# Patient Record
Sex: Male | Born: 1950 | Race: White | Hispanic: No | Marital: Married | Smoking: Never smoker
Health system: Southern US, Community
[De-identification: ages and names within clinical notes are randomized; demographics above are authoritative.]

## PROBLEM LIST (undated history)

## (undated) DIAGNOSIS — C61 Malignant neoplasm of prostate: Secondary | ICD-10-CM

## (undated) DIAGNOSIS — C801 Malignant (primary) neoplasm, unspecified: Secondary | ICD-10-CM

## (undated) DIAGNOSIS — H539 Unspecified visual disturbance: Secondary | ICD-10-CM

## (undated) DIAGNOSIS — R519 Headache, unspecified: Secondary | ICD-10-CM

## (undated) DIAGNOSIS — R51 Headache: Secondary | ICD-10-CM

## (undated) HISTORY — DX: Headache: R51

## (undated) HISTORY — PX: PROSTATECTOMY: SHX69

## (undated) HISTORY — PX: CHOLECYSTECTOMY, LAPAROSCOPIC: SHX56

## (undated) HISTORY — DX: Malignant (primary) neoplasm, unspecified: C80.1

## (undated) HISTORY — DX: Malignant neoplasm of prostate: C61

## (undated) HISTORY — DX: Headache, unspecified: R51.9

## (undated) HISTORY — PX: WISDOM TOOTH EXTRACTION: SHX21

## (undated) HISTORY — DX: Unspecified visual disturbance: H53.9

---

## 1999-06-15 ENCOUNTER — Ambulatory Visit (HOSPITAL_COMMUNITY): Admission: RE | Admit: 1999-06-15 | Discharge: 1999-06-15 | Payer: Self-pay | Admitting: *Deleted

## 2003-03-17 DIAGNOSIS — C61 Malignant neoplasm of prostate: Secondary | ICD-10-CM | POA: Insufficient documentation

## 2003-03-17 DIAGNOSIS — G9332 Myalgic encephalomyelitis/chronic fatigue syndrome: Secondary | ICD-10-CM | POA: Insufficient documentation

## 2013-09-01 DIAGNOSIS — M5430 Sciatica, unspecified side: Secondary | ICD-10-CM | POA: Insufficient documentation

## 2013-09-01 DIAGNOSIS — Q2111 Secundum atrial septal defect: Secondary | ICD-10-CM | POA: Insufficient documentation

## 2013-09-01 DIAGNOSIS — Q210 Ventricular septal defect: Secondary | ICD-10-CM | POA: Insufficient documentation

## 2013-09-01 DIAGNOSIS — C61 Malignant neoplasm of prostate: Secondary | ICD-10-CM | POA: Insufficient documentation

## 2013-09-01 DIAGNOSIS — M5126 Other intervertebral disc displacement, lumbar region: Secondary | ICD-10-CM | POA: Insufficient documentation

## 2013-09-01 DIAGNOSIS — E559 Vitamin D deficiency, unspecified: Secondary | ICD-10-CM | POA: Insufficient documentation

## 2013-09-01 DIAGNOSIS — K436 Other and unspecified ventral hernia with obstruction, without gangrene: Secondary | ICD-10-CM | POA: Insufficient documentation

## 2013-09-01 DIAGNOSIS — F528 Other sexual dysfunction not due to a substance or known physiological condition: Secondary | ICD-10-CM | POA: Insufficient documentation

## 2013-09-01 DIAGNOSIS — R9409 Abnormal results of other function studies of central nervous system: Secondary | ICD-10-CM | POA: Insufficient documentation

## 2013-09-01 DIAGNOSIS — R5382 Chronic fatigue, unspecified: Secondary | ICD-10-CM

## 2013-09-01 DIAGNOSIS — K8020 Calculus of gallbladder without cholecystitis without obstruction: Secondary | ICD-10-CM | POA: Insufficient documentation

## 2013-09-01 DIAGNOSIS — Q211 Atrial septal defect: Secondary | ICD-10-CM | POA: Insufficient documentation

## 2013-09-01 DIAGNOSIS — D1800 Hemangioma unspecified site: Secondary | ICD-10-CM | POA: Insufficient documentation

## 2013-09-01 DIAGNOSIS — H8109 Meniere's disease, unspecified ear: Secondary | ICD-10-CM | POA: Insufficient documentation

## 2013-09-01 DIAGNOSIS — R972 Elevated prostate specific antigen [PSA]: Secondary | ICD-10-CM | POA: Insufficient documentation

## 2013-09-01 DIAGNOSIS — M5431 Sciatica, right side: Secondary | ICD-10-CM | POA: Insufficient documentation

## 2013-09-01 DIAGNOSIS — J3800 Paralysis of vocal cords and larynx, unspecified: Secondary | ICD-10-CM | POA: Insufficient documentation

## 2013-09-01 DIAGNOSIS — K5730 Diverticulosis of large intestine without perforation or abscess without bleeding: Secondary | ICD-10-CM | POA: Insufficient documentation

## 2013-09-01 DIAGNOSIS — R17 Unspecified jaundice: Secondary | ICD-10-CM | POA: Insufficient documentation

## 2013-09-01 DIAGNOSIS — D8989 Other specified disorders involving the immune mechanism, not elsewhere classified: Secondary | ICD-10-CM | POA: Insufficient documentation

## 2013-09-01 DIAGNOSIS — S43429A Sprain of unspecified rotator cuff capsule, initial encounter: Secondary | ICD-10-CM | POA: Insufficient documentation

## 2013-09-01 DIAGNOSIS — J383 Other diseases of vocal cords: Secondary | ICD-10-CM | POA: Insufficient documentation

## 2013-09-01 DIAGNOSIS — K219 Gastro-esophageal reflux disease without esophagitis: Secondary | ICD-10-CM | POA: Insufficient documentation

## 2013-09-01 DIAGNOSIS — G9332 Myalgic encephalomyelitis/chronic fatigue syndrome: Secondary | ICD-10-CM | POA: Insufficient documentation

## 2013-09-01 DIAGNOSIS — R9089 Other abnormal findings on diagnostic imaging of central nervous system: Secondary | ICD-10-CM | POA: Insufficient documentation

## 2013-09-01 DIAGNOSIS — H81319 Aural vertigo, unspecified ear: Secondary | ICD-10-CM | POA: Insufficient documentation

## 2013-09-01 DIAGNOSIS — D485 Neoplasm of uncertain behavior of skin: Secondary | ICD-10-CM | POA: Insufficient documentation

## 2013-09-01 DIAGNOSIS — B0089 Other herpesviral infection: Secondary | ICD-10-CM | POA: Insufficient documentation

## 2013-12-04 DIAGNOSIS — G43019 Migraine without aura, intractable, without status migrainosus: Secondary | ICD-10-CM | POA: Insufficient documentation

## 2013-12-28 DIAGNOSIS — K644 Residual hemorrhoidal skin tags: Secondary | ICD-10-CM | POA: Insufficient documentation

## 2014-06-27 DIAGNOSIS — G43009 Migraine without aura, not intractable, without status migrainosus: Secondary | ICD-10-CM | POA: Insufficient documentation

## 2014-08-28 ENCOUNTER — Telehealth: Payer: Self-pay | Admitting: Neurology

## 2014-08-28 NOTE — Telephone Encounter (Signed)
I have spoken with [facility] at [facility], and advised that although Mr. Ohlin was a former pt. of Dr. Garth Bigness at [facility],  he has not so far been seen at [facility]--we are not able to refill medications for him until he has been seen in this office.  Myra verbalized understanding of same/fim

## 2014-08-28 NOTE — Telephone Encounter (Signed)
I have spoken with Eduardo Lin this afternoon and advised that unfortunately, [facility] is not able to r/f meds until he is seen in this office.  He verbalized understanding of same/fim

## 2014-08-28 NOTE — Telephone Encounter (Signed)
Myra with [facility] would like a call back regarding the 90-day refill for patient. Please call Myra at [phone]

## 2014-08-28 NOTE — Telephone Encounter (Signed)
Patient is calling requesting a medication refill. Please call patient back and advise.

## 2014-09-04 ENCOUNTER — Encounter: Payer: Self-pay | Admitting: Neurology

## 2014-09-04 ENCOUNTER — Ambulatory Visit (INDEPENDENT_AMBULATORY_CARE_PROVIDER_SITE_OTHER): Payer: BLUE CROSS/BLUE SHIELD | Admitting: Neurology

## 2014-09-04 ENCOUNTER — Ambulatory Visit: Payer: Self-pay | Admitting: Neurology

## 2014-09-04 VITALS — BP 114/68 | HR 72 | Resp 14 | Ht 74.0 in | Wt 180.2 lb

## 2014-09-04 DIAGNOSIS — R5382 Chronic fatigue, unspecified: Secondary | ICD-10-CM | POA: Diagnosis not present

## 2014-09-04 DIAGNOSIS — G43019 Migraine without aura, intractable, without status migrainosus: Secondary | ICD-10-CM | POA: Diagnosis not present

## 2014-09-04 DIAGNOSIS — D8989 Other specified disorders involving the immune mechanism, not elsewhere classified: Secondary | ICD-10-CM

## 2014-09-04 MED ORDER — SUMATRIPTAN SUCCINATE 100 MG PO TABS: ORAL_TABLET | ORAL | Status: DC

## 2014-09-04 NOTE — Progress Notes (Signed)
[facility]  PATIENT: Eduardo Lin DOB: 04/24/1950  REFERRING DOCTOR OR PCP:  Jesse Fall  SOURCE: patient and records form [facility]  _________________________________   HISTORICAL  CHIEF COMPLAINT:  Chief Complaint  Patient presents with  . Headaches    Former pt. of Dr. Garth Bigness from [facility], sts. he has had h/a's for yrs.--sts. he is currently taking Zonisamide 200mg  daily, and uses Imitrex prn.  Sts. it is difficult to tell if Zonisamide helps--sts. he goes thru spells where h/a's are daily--but sts. if he does get a h/a, Imitrex does help.  Sts. h/a's are disabling and prevent him from working/fim  . Fatigue    Sts. Dr. Lenna Gilford treats him for chronic fatigue syndrome--he is not currently taking any meds for fatigue--sts. nothing has ever helped/fim    HISTORY OF PRESENT ILLNESS:  [name] is a 64 yo man who I have followed in the past for Migraine Headaches and Chronic Fatigue Syndrome.     Chronic Headache:   He continues to have frequent migraine headaches.     His migraines are chronic and he is having 3 or 4 every week for the majority of the day when they occur.  He gets a throbbing ice pick sensation in the top of the head. Sometimes, moving will increase the pain. He sometimes will have photophobia but does not have phonophobia.. He does not get nausea or vomiting, in general. Sometimes, he will have pain in the back of the neck, as well.   He received some benefit with his zonisamide at  200 mg nightly. However, the headaches are still occurring about 15 times a month. Most of the time, he will not treat the headaches but will use Imitrex when the headaches are more severe.   He was recently prescribed Imitrex shots.   Due to his fatigue, I have not had him try a tricyclic or topiramate as they might worsen his symptoms  Chronic Fatigue:    He has had chronic fatigue for several years that worsened in 2014. The fatigue is daily. He  notes that he wants to do certain tasks but just feels his muscles become  too tired to let him do various things.   We have tried several different medications to try to help including amantadine, Adderall, and Provigil. He sleeps fairly well at night and will usually wake up just wants for nocturia. He had a sleep study done in 2008 and had no OSA. He is within does not snore. In the past, he has seen an infectious disease at [facility] and [place] for the chronic fatigue but no definitive cause of the CFS was ever identified.  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 However, he has been unable to do these higher-level jobs for the past few years due to his chronic fatigue. He even tried to do a more sedentary repetitive job at the [employer] but was unable to do his duties and left after 2 weeks.   He has difficulty completing more physical chores around the home.   REVIEW OF SYSTEMS: Constitutional: No fevers, chills, sweats, or change in appetite.   He has fatigue Eyes: No visual changes, double vision, eye pain Ear, nose and throat: No hearing loss, ear pain, nasal congestion, sore throat Cardiovascular: No chest pain, palpitations Respiratory: No shortness of breath at rest or with exertion.  No wheezes. No snoring. GastrointestinaI: No nausea, vomiting, diarrhea, abdominal pain, fecal incontinence Genitourinary: No dysuria, urinary retention or frequency.  Once nightly nocturia. Musculoskeletal: No neck pain, back pain Integumentary: No rash, pruritus, skin lesions Neurological: as above Psychiatric: No depression at this time.  No anxiety Endocrine: No palpitations, diaphoresis, change in appetite, change in weigh or increased thirst Hematologic/Lymphatic: No anemia, purpura, petechiae. Allergic/Immunologic: No itchy/runny eyes, nasal congestion, recent allergic reactions,  rashes  ALLERGIES: Allergies  Allergen Reactions  . Hydrocodone-Acetaminophen     Other reaction(s): Other (See Comments) Other Reaction: hallucinate    HOME MEDICATIONS:  Current outpatient prescriptions:  .  Cholecalciferol (VITAMIN D3) 1000 UNITS CAPS, Take 3 caps daily, Disp: , Rfl:  .  Magnesium 250 MG TABS, Take 2 tabs daily, Disp: , Rfl:  .  Probiotic Product (PROBIOTIC & ACIDOPHILUS EX ST PO), , Disp: , Rfl:  .  SUMAtriptan (IMITREX) 100 MG tablet, 100 mg., Disp: , Rfl:  .  zonisamide (ZONEGRAN) 100 MG capsule, TAKE 2 CAPSULE BEDTIME, Disp: , Rfl: 3  PAST MEDICAL HISTORY: Past Medical History  Diagnosis Date  . Cancer   . Prostate cancer   . Headache   . Vision abnormalities     PAST SURGICAL HISTORY: Past Surgical History  Procedure Laterality Date  . Prostatectomy    . Cholecystectomy, laparoscopic    . Wisdom tooth extraction      FAMILY HISTORY: History reviewed. No pertinent family history.  SOCIAL HISTORY:  History   Social History  . Marital Status: Married    Spouse Name: N/A  . Number of Children: N/A  . Years of Education: N/A   Occupational History  . Not on file.   Social History Main Topics  . Smoking status: Never Smoker   . Smokeless tobacco: Not on file  . Alcohol Use: No  . Drug Use: No  . Sexual Activity: Not on file   Other Topics Concern  . Not on file   Social History Narrative  . No narrative on file     PHYSICAL EXAM  Filed Vitals:   09/04/14 1353  BP: 114/68  Pulse: 72  Resp: 14  Height: 6\' 2"  (1.88 m)  Weight: 180 lb 3.2 oz (81.738 kg)    Body mass index is 23.13 kg/(m^2).   General: The patient is well-developed and well-nourished and in no acute distress  Eyes:  Funduscopic exam shows normal optic discs and retinal vessels.  Neck: The neck is supple, no carotid bruits are noted.  The neck is nontender.  Cardiovascular: The heart has a regular rate and rhythm with a normal S1 and S2. There were no  murmurs, gallops or rubs. Lungs are clear to auscultation.  Skin: Extremities are without significant edema.  Musculoskeletal:  Back is nontender  Neurologic Exam  Mental status: The patient is alert and oriented x 3 at the time of the examination. The patient has apparent normal recent and remote memory, with an apparently normal attention span and concentration ability.   Speech is normal.  Cranial nerves: Extraocular movements are full. Pupils are equal, round, and reactive to light and accomodation.  Visual fields are full.  Facial symmetry is present. There is good facial sensation to soft touch bilaterally.Facial strength is normal.  Trapezius and sternocleidomastoid strength is normal. No dysarthria is noted.  The tongue is midline, and the patient has symmetric elevation of the soft palate. No obvious hearing deficits are noted.  Motor:  Muscle bulk is normal.  Tone is normal. Strength is  5 / 5 in all 4 extremities.   Sensory: Sensory testing is intact to pinprick, soft touch and vibration sensation in all 4 extremities.  Coordination: Cerebellar testing reveals good finger-nose-finger and heel-to-shin bilaterally.  Gait and station: Station is normal.   Gait is normal. Tandem gait is normal. Romberg is negative.   Reflexes: Deep tendon reflexes are symmetric and normal bilaterally.   Plantar responses are flexor.    DIAGNOSTIC DATA (LABS, IMAGING, TESTING) - I reviewed patient records, labs, notes, testing and imaging myself where available.     ASSESSMENT AND PLAN  Common migraine with intractable migraine  CFIDS (chronic fatigue and immune dysfunction syndrome)   1.   Continues zonisamide with prn Imitrex for the migraine headaches. 2.   His chronic fatigue has not responded to many different medications. He is having difficulty functioning with day-to-day chores and was unable to do sedentary job. Due to his chronic fatigue, I believe that he is disabled and he  will not be able to find employment. 3.  Return in 6 months, sooner if new or worsening neurologic symptoms.     Richard A. Felecia Shelling, MD, PhD 8/88/2800, 3:49 PM Certified in Neurology, Clinical Neurophysiology, Sleep Medicine, Pain Medicine and Neuroimaging  [facility] [address], [place] [employer] [address] [phone]

## 2015-03-13 ENCOUNTER — Ambulatory Visit: Payer: BLUE CROSS/BLUE SHIELD | Admitting: Neurology

## 2015-03-14 ENCOUNTER — Ambulatory Visit (INDEPENDENT_AMBULATORY_CARE_PROVIDER_SITE_OTHER): Payer: BLUE CROSS/BLUE SHIELD | Admitting: Neurology

## 2015-03-14 ENCOUNTER — Encounter: Payer: Self-pay | Admitting: Neurology

## 2015-03-14 VITALS — BP 106/70 | HR 63 | Ht 74.0 in | Wt 177.6 lb

## 2015-03-14 DIAGNOSIS — H81313 Aural vertigo, bilateral: Secondary | ICD-10-CM

## 2015-03-14 DIAGNOSIS — R928 Other abnormal and inconclusive findings on diagnostic imaging of breast: Secondary | ICD-10-CM

## 2015-03-14 DIAGNOSIS — R5382 Chronic fatigue, unspecified: Secondary | ICD-10-CM

## 2015-03-14 DIAGNOSIS — D8989 Other specified disorders involving the immune mechanism, not elsewhere classified: Secondary | ICD-10-CM

## 2015-03-14 DIAGNOSIS — R51 Headache: Secondary | ICD-10-CM | POA: Diagnosis not present

## 2015-03-14 DIAGNOSIS — G8929 Other chronic pain: Secondary | ICD-10-CM | POA: Insufficient documentation

## 2015-03-14 DIAGNOSIS — R9409 Abnormal results of other function studies of central nervous system: Secondary | ICD-10-CM

## 2015-03-14 NOTE — Progress Notes (Addendum)
[facility]  PATIENT: Eduardo Lin DOB: 1950-11-09  REFERRING DOCTOR OR PCP:  Jesse Fall  SOURCE: patient and records form [facility]  _________________________________   HISTORICAL  CHIEF COMPLAINT:  Chief Complaint  Patient presents with  . Follow-up    migraine and chronic faitgue syndrome    HISTORY OF PRESENT ILLNESS:  Eduardo Lin is a 64 yo man who I have followed in the past for Migraine Headaches and Chronic Fatigue Syndrome.     He feels both issues are no better.      Chronic Headache:   He reports frequent migraine headaches that are worsening.   His migraines are chronic and he is having 3 or 4 every week for the majority of the day when they occur.  He has them 15 days / week.  Marland Kitchen  He reports a throbbing ice pick sensation in the top of the head at times about 1/2 the week. Sometimes, moving will increase the pain. He sometimes will have photophobia but does not have phonophobia.. He does not get nausea or vomiting, in general. Sometimes, he will have pain in the back of the neck, as well.   This pain can occur with the other headache or separatey.   He received some benefit with his zonisamide at  200 mg nightly.  Most of the time, he will not treat the headaches but will use Imitrex when the headaches are more severe with benefit.   He was recently prescribed Imitrex shots.   Due to his fatigue, I have not had him try a tricyclic or topiramate as they might worsen his symptoms.  Chronic Fatigue:    He reports chronic fatigue since prostate surgery in 2005 that worsened in 2014. The fatigue is daily. He notes that he wants to do certain tasks but just feels his muscles become  too tired to let him do various things.   We have tried several different medications to try to help including amantadine, Adderall, and Provigil.None of the stimulants helped so they were stopped.    He sleeps fairly well at night and will usually wake up just for  nocturia x 1. He had a sleep study done in 2008 and had no OSA. He is within does not snore. Besides [place], In the past, he has also seen an infectious disease at [facility] and [place] for the chronic fatigue but no definitive cause of the CFS was ever identified.  Vertigo:   He has seen Dr. May, ENT at [facility].   He notes that MRI at [facility] in 01/25/2006 showed a lot of white matter changes "white matter disease with leukoaraiosis".   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 However, he has been unable to do these higher-level jobs for the past few years due to his chronic fatigue. He even tried to do a more sedentary repetitive job at the [employer] but was unable to do his duties and left after 2 weeks.   He has difficulty completing more physical chores around the home.   REVIEW OF SYSTEMS: Constitutional: No fevers, chills, sweats, or change in appetite.   He has fatigue Eyes: No visual changes, double vision, eye pain Ear, nose and throat: No hearing loss, ear pain, nasal congestion, sore throat Cardiovascular:  No chest pain, palpitations Respiratory: No shortness of breath at rest or with exertion.   No wheezes. No snoring. GastrointestinaI: No nausea, vomiting, diarrhea, abdominal pain, fecal incontinence Genitourinary: No dysuria, urinary retention or frequency.  Once nightly nocturia. Musculoskeletal: No neck pain, back pain Integumentary: No rash, pruritus, skin lesions Neurological: as above Psychiatric: No depression at this time.  No anxiety Endocrine: No palpitations, diaphoresis, change in appetite, change in weigh or increased thirst Hematologic/Lymphatic: No anemia, purpura, petechiae. Allergic/Immunologic: No itchy/runny eyes, nasal congestion, recent allergic reactions,  rashes  ALLERGIES: Allergies  Allergen Reactions  . Hydrocodone-Acetaminophen     Other reaction(s): Other (See Comments) Other Reaction: hallucinate    HOME MEDICATIONS:  Current outpatient prescriptions:  .  Cholecalciferol (VITAMIN D3) 1000 UNITS CAPS, take 2 daily, Disp: , Rfl:  .  fluticasone (FLONASE) 50 MCG/ACT nasal spray, , Disp: , Rfl:  .  Magnesium 250 MG TABS, Take 1 daily, Disp: , Rfl:  .  Probiotic Product (PROBIOTIC & ACIDOPHILUS EX ST PO), , Disp: , Rfl:  .  SUMAtriptan (IMITREX) 100 MG tablet, 1 pill as needed when necessary headache. May repeat in 2 hours if necessary., Disp: 30 tablet, Rfl: 3 .  zonisamide (ZONEGRAN) 100 MG capsule, TAKE 2 CAPSULE BEDTIME, Disp: , Rfl: 3  PAST MEDICAL HISTORY: Past Medical History  Diagnosis Date  . Cancer ([place])   . Prostate cancer ([place])   . Headache   . Vision abnormalities     PAST SURGICAL HISTORY: Past Surgical History  Procedure Laterality Date  . Prostatectomy    . Cholecystectomy, laparoscopic    . Wisdom tooth extraction      FAMILY HISTORY: History reviewed. No pertinent family history.  SOCIAL HISTORY:  Social History   Social History  . Marital Status: Married    Spouse Name: N/A  . Number of Children: N/A  . Years of Education: N/A   Occupational History  . Not on file.   Social History Main Topics  . Smoking status: Never Smoker   . Smokeless tobacco: Not on file  . Alcohol Use: No  . Drug Use: No  . Sexual Activity: Not on file   Other Topics Concern  . Not on file   Social History Narrative     PHYSICAL EXAM  Filed Vitals:   03/14/15 1132  BP: 106/70  Pulse: 63  Height: 6\' 2"  (1.88 m)  Weight: 177 lb 9.6 oz (80.559 kg)    Body mass index is 22.79 kg/(m^2).   General: The patient is well-developed and well-nourished and in no acute distress  Eyes:  Funduscopic exam shows normal optic discs and retinal vessels.  Neck: The neck is supple, no carotid bruits are noted.   The neck is non-tender today  Cardiovascular: The heart has a regular rate and rhythm with a normal S1 and S2. There were no murmurs, gallops or rubs. Lungs are clear to auscultation.  Skin: Extremities are without significant edema.  Musculoskeletal:  Back is nontender  Neurologic Exam  Mental status: The patient is alert and oriented x 3 at the time of the examination. The patient has apparent normal recent and remote memory, with an apparently normal attention span and concentration ability.   Speech is normal.  Cranial nerves: Extraocular movements are full. Pupils are equal, round, and reactive to light and accomodation.  Visual fields are full.  Facial symmetry is present. There is good facial sensation to soft touch bilaterally.Facial strength is normal.  Trapezius and sternocleidomastoid  strength is normal. No dysarthria is noted.  The tongue is midline, and the patient has symmetric elevation of the soft palate. No obvious hearing deficits are noted.  Motor:  Muscle bulk is normal.   Tone is normal. Strength is  5 / 5 in all 4 extremities.   Sensory: Sensory testing is intact to touch and vibration sensation in all 4 extremities.  Coordination: Cerebellar testing reveals good finger-nose-finger bilaterally.  Gait and station: Station is normal.   Gait is mildly wide. Tandem gait is wide. Romberg is negative.   Reflexes: Deep tendon reflexes are symmetric and normal bilaterally.   Plantar responses are flexor.    DIAGNOSTIC DATA (LABS, IMAGING, TESTING) - I reviewed patient records, labs, notes, testing and imaging myself where available.     ASSESSMENT AND PLAN  CFIDS (chronic fatigue and immune dysfunction syndrome)  Abnormal CNS function study  Auditory vertigo, bilateral - Plan: MR Brain W Wo Contrast  Chronic nonintractable headache, unspecified headache type - Plan: MR Brain W Wo Contrast   1.   Continues zonisamide with prn Imitrex for the migraine  headaches. 2.   We will check an MRI of the brain due to his worsening headaches and vertigo. I will try to get his 2007 MRI that was abnormal to compare if it is available.   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   if pattern is consistent with CADASIL, consider checking for notch 3 mutation. 3.   His chronic fatigue has not responded to many different medications. He is having difficulty functioning with day-to-day chores and unable to do sedentary job. Due to his chronic fatigue, I believe that he is disabled and he will not be able to find employment. 3.  Return in 6 months, sooner if new or worsening neurologic symptoms.  45 minutes face to face interaction with > 1/2 of the time counseling and coordinating care about his symptoms   Richard A. Felecia Shelling, MD, PhD [id number], [id number] AM Certified in Neurology, Clinical Neurophysiology, Sleep Medicine, Pain Medicine and Neuroimaging  [facility] [address], [place], [place] [address] [phone] tt

## 2015-03-20 ENCOUNTER — Telehealth: Payer: Self-pay | Admitting: *Deleted

## 2015-03-20 NOTE — Telephone Encounter (Signed)
Release fax to [facility] imaging requesting MRI CD.

## 2015-03-22 ENCOUNTER — Telehealth: Payer: Self-pay | Admitting: Neurology

## 2015-03-22 NOTE — Telephone Encounter (Signed)
Patient returned Faith's call °

## 2015-03-22 NOTE — Telephone Encounter (Signed)
[place].  I have requested 2013 mri brain from [facility] and 03-15-15 mri brain from [facility]/fim

## 2015-03-22 NOTE — Telephone Encounter (Signed)
Faith, please let him know that I got the MRI report from [place] and shows some nonspecific white matter spots. Usually when there adjusts some scattered spots like this, it is related to aging and is not meaningful.  I will try to get the actual MRI images from [facility] and compare them with the MRI from [facility] that were done in the past. Unfortunate, I do not have either of the images now so we'll need to get them both mailed to me.  Please also see if we can get the images from [facility] (03/15/15 MRI     [id number]) and [facility] (2013 MRI)

## 2015-03-22 NOTE — Telephone Encounter (Signed)
I have spoken with Eduardo Lin this afternoon and per RAS, advised that he has received the mri report from [facility] but has not actually seen the mri images himself.  I advised that per RAS, report notes nonspecific white matter spots, and that these spots are usually related to aging and are not meaningful.  Eduardo Lin verbalized understanding of same.  Eduardo Lin stated he felt RAS was going to compare mri to a 2007 mri.  I have already requested 2013 mri for comparison--I did call [facility] again and request all mri brains that they have for pt. on cd and mailed to RAS/fim

## 2015-03-22 NOTE — Telephone Encounter (Signed)
Margarete at [facility] called and says the imaging cd will either be mailed out Monday or Tuesday.

## 2015-04-24 ENCOUNTER — Telehealth: Payer: Self-pay | Admitting: Neurology

## 2015-04-24 NOTE — Telephone Encounter (Signed)
I have compared the 03/12/2009, 04/12/2011 and 03/05/2015 MRI (the 2007 MRI will not load on 2 different computers)  Over the MRI show T2/FLAIR hyperintense foci, predominantly in the subcortical white matter consistent with chronic microvascular ischemic change. Over the 3 MRIs, there has been only minimal progression.   The 2016 MRI was on a 3T magnet

## 2015-05-22 ENCOUNTER — Encounter: Payer: Self-pay | Admitting: Neurology

## 2015-07-04 ENCOUNTER — Ambulatory Visit: Payer: BLUE CROSS/BLUE SHIELD | Admitting: Neurology

## 2015-08-06 DIAGNOSIS — N3281 Overactive bladder: Secondary | ICD-10-CM | POA: Insufficient documentation

## 2015-08-06 DIAGNOSIS — N5231 Erectile dysfunction following radical prostatectomy: Secondary | ICD-10-CM | POA: Insufficient documentation

## 2015-08-06 DIAGNOSIS — N529 Male erectile dysfunction, unspecified: Secondary | ICD-10-CM | POA: Insufficient documentation

## 2015-09-09 ENCOUNTER — Encounter: Payer: Self-pay | Admitting: Neurology

## 2015-09-09 ENCOUNTER — Ambulatory Visit (INDEPENDENT_AMBULATORY_CARE_PROVIDER_SITE_OTHER): Payer: BLUE CROSS/BLUE SHIELD | Admitting: Neurology

## 2015-09-09 VITALS — BP 116/72 | HR 68 | Resp 12 | Ht 74.0 in | Wt 175.0 lb

## 2015-09-09 DIAGNOSIS — D8989 Other specified disorders involving the immune mechanism, not elsewhere classified: Secondary | ICD-10-CM

## 2015-09-09 DIAGNOSIS — G43019 Migraine without aura, intractable, without status migrainosus: Secondary | ICD-10-CM

## 2015-09-09 DIAGNOSIS — R5382 Chronic fatigue, unspecified: Secondary | ICD-10-CM | POA: Diagnosis not present

## 2015-09-09 DIAGNOSIS — G9332 Myalgic encephalomyelitis/chronic fatigue syndrome: Secondary | ICD-10-CM

## 2015-09-09 DIAGNOSIS — J309 Allergic rhinitis, unspecified: Secondary | ICD-10-CM | POA: Insufficient documentation

## 2015-09-09 DIAGNOSIS — G43709 Chronic migraine without aura, not intractable, without status migrainosus: Secondary | ICD-10-CM | POA: Insufficient documentation

## 2015-09-09 DIAGNOSIS — G43909 Migraine, unspecified, not intractable, without status migrainosus: Secondary | ICD-10-CM | POA: Insufficient documentation

## 2015-09-09 DIAGNOSIS — IMO0002 Reserved for concepts with insufficient information to code with codable children: Secondary | ICD-10-CM | POA: Insufficient documentation

## 2015-09-09 MED ORDER — SUMATRIPTAN SUCCINATE 100 MG PO TABS: ORAL_TABLET | ORAL | Status: DC

## 2015-09-09 MED ORDER — NORTRIPTYLINE HCL 25 MG PO CAPS: 25.0000 mg | ORAL_CAPSULE | Freq: Every day | ORAL | Status: DC

## 2015-09-09 MED ORDER — ZONISAMIDE 100 MG PO CAPS
200.0000 mg | ORAL_CAPSULE | Freq: Every day | ORAL | Status: DC
Start: 2015-09-09 — End: 2017-09-27

## 2015-09-09 NOTE — Progress Notes (Signed)
[facility]  PATIENT: Eduardo Lin DOB: 02-22-1951  REFERRING DOCTOR OR PCP:  Jesse Fall  SOURCE: patient and records form [facility]  _________________________________   HISTORICAL  CHIEF COMPLAINT:  Chief Complaint  Patient presents with  . Chronic Fatigue    Sts. fatigue is worse/fim  . Migraines    HISTORY OF PRESENT ILLNESS:  Eduardo Lin is a 65 y.o.man who I have followed in the past for Migraine Headaches and Chronic Fatigue Syndrome.     He feels both issues are worse.    Fatigue is constant now.      Migraines are occuring more frequently.   He feels mentally foggy.   .      Chronic Fatigue:    He reports chronic fatigue x many years that is both physical and cognitive.    He began to experience it in 2005 after surgery and fatigue worsened in 2014. The fatigue is daily and present upon awakening but usually worsens as the day goes on.      We have tried several different medications to try to help including amantadine, Adderall, and Provigil.    None of the stimulants helped so they were stopped.   Many years ago, he saw an infectious disease at [facility] and [place] for the chronic fatigue but no definitive cause of the CFS was ever identified.     CFS may be related to the white matter changes seen on MRI.  TSH, CBC, Chemistries and Vit D have been.       He sleeps fairly well at night and will usually wake up just for nocturia x 1 (was worse before Vesicare). He had a sleep study done in 2008 and had no OSA. He  does not snore.       Chronic Headache:   He reports frequent migraine headaches that are worsening.   His migraines are chronic and he is having 3 or 4 every week for the majority of the day when they occur.  He has them 18 days / month.  .  He reports a throbbing ice pick sensation in the top of the head at times about 1/2 the week. He gets neck pain also.  Sometimes, moving will increase the pain.    He sometimes will have  photophobia but does not have phonophobia.. He denies nausea or vomiting    He received some benefit with his zonisamide at  200 mg nightly at first but now headache frequency is high again.  He will use Imitrex when the headaches are more severe with benefit.   He was recently prescribed Imitrex shots.   Nortriptyline has also been tried.    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 However, he has been unable to do these higher-level jobs for the past few years due to his chronic fatigue. He even tried to do a more sedentary repetitive job at the [employer] but was unable to do his duties and left after 2 weeks.   He has difficulty completing more physical chores around the home.   REVIEW OF SYSTEMS: Constitutional: No fevers, chills, sweats, or change in appetite.   He has fatigue Eyes: No visual changes, double vision, eye pain Ear, nose and throat: No hearing loss, ear pain, nasal congestion, sore throat Cardiovascular: No chest pain, palpitations Respiratory: No shortness of breath at rest  or with exertion.   No wheezes. No snoring. GastrointestinaI: No nausea, vomiting, diarrhea, abdominal pain, fecal incontinence Genitourinary: No dysuria, urinary retention or frequency.  Once nightly nocturia. Musculoskeletal: No neck pain, back pain Integumentary: No rash, pruritus, skin lesions Neurological: as above Psychiatric: No depression at this time.  No anxiety Endocrine: No palpitations, diaphoresis, change in appetite, change in weigh or increased thirst Hematologic/Lymphatic: No anemia, purpura, petechiae. Allergic/Immunologic: No itchy/runny eyes, nasal congestion, recent allergic reactions, rashes  ALLERGIES: Allergies  Allergen Reactions  . Hydrocodone-Acetaminophen     Other reaction(s): Other (See Comments) Other Reaction: hallucinate    HOME MEDICATIONS:  Current  outpatient prescriptions:  .  Cholecalciferol (VITAMIN D3) 1000 UNITS CAPS, take 2 daily, Disp: , Rfl:  .  fluticasone (FLONASE) 50 MCG/ACT nasal spray, , Disp: , Rfl:  .  Magnesium 250 MG TABS, Take 1 daily, Disp: , Rfl:  .  Probiotic Product (PROBIOTIC & ACIDOPHILUS EX ST PO), , Disp: , Rfl:  .  SUMAtriptan (IMITREX) 100 MG tablet, 1 pill as needed when necessary headache. May repeat in 2 hours if necessary., Disp: 30 tablet, Rfl: 3 .  zonisamide (ZONEGRAN) 100 MG capsule, Take 2 capsules (200 mg total) by mouth daily., Disp: 180 capsule, Rfl: 3 .  nortriptyline (PAMELOR) 25 MG capsule, Take 1 capsule (25 mg total) by mouth at bedtime., Disp: 30 capsule, Rfl: 5 .  VESICARE 10 MG tablet, TAKE 1 TABLET (10 MG TOTAL) BY MOUTH ONCE DAILY., Disp: , Rfl: 11  PAST MEDICAL HISTORY: Past Medical History  Diagnosis Date  . Cancer ([place])   . Prostate cancer ([place])   . Headache   . Vision abnormalities     PAST SURGICAL HISTORY: Past Surgical History  Procedure Laterality Date  . Prostatectomy    . Cholecystectomy, laparoscopic    . Wisdom tooth extraction      FAMILY HISTORY: History reviewed. No pertinent family history.  SOCIAL HISTORY:  Social History   Social History  . Marital Status: Married    Spouse Name: N/A  . Number of Children: N/A  . Years of Education: N/A   Occupational History  . Not on file.   Social History Main Topics  . Smoking status: Never Smoker   . Smokeless tobacco: Not on file  . Alcohol Use: No  . Drug Use: No  . Sexual Activity: Not on file   Other Topics Concern  . Not on file   Social History Narrative     PHYSICAL EXAM  Filed Vitals:   09/09/15 1106  BP: 116/72  Pulse: 68  Resp: 12  Height: 6\' 2"  (1.88 m)  Weight: 175 lb (79.379 kg)    Body mass index is 22.46 kg/(m^2).   General: The patient is well-developed and well-nourished and in no acute distress  Skin: Extremities are without rash or edema.  Musculoskeletal:  Back  is nontender  Neurologic Exam  Mental status: The patient is alert and oriented x 3 at the time of the examination. The patient has apparent normal recent and remote memory, with an apparently normal attention span and concentration ability.   Speech is normal.  Cranial nerves: Extraocular movements are full.  Facial symmetry is present. There is good facial sensation to soft touch bilaterally.Facial strength is normal.  Trapezius and sternocleidomastoid strength is normal. No dysarthria is noted.  The tongue is midline, and the patient has symmetric elevation of the soft palate. No obvious hearing deficits are noted.  Motor:  Muscle bulk  is normal.   Tone is normal. Strength is  5 / 5 in all 4 extremities.   Sensory: Sensory testing is intact to touch and vibration sensation in all 4 extremities.  Coordination: Cerebellar testing reveals good finger-nose-finger bilaterally.  Gait and station: Station is normal.   Gait is mildly wide. Tandem gait is wide. Romberg is negative.   Reflexes: Deep tendon reflexes are symmetric and normal bilaterally.  Marland Kitchen    DIAGNOSTIC DATA (LABS, IMAGING, TESTING) - I reviewed patient records, labs, notes, testing and imaging myself where available.     ASSESSMENT AND PLAN  Common migraine with intractable migraine  CFIDS (chronic fatigue and immune dysfunction syndrome) - Plan: B. burgdorfi antibodies, TSH  Chronic migraine    1.    His chronic fatigue has not responded to different medications including stimulants.    He is having difficulty functioning with day-to-day chores and unable to do sedentary job. Due to his chronic fatigue, I believe that he is disabled and he will not be able to find employment. 2.    Check Lyme/Western and TSH 3.    He has failed multiple different med's for chronic migraine.   Continue zonisamide.  Add  Nortriptyline 25 mg at bedtime.     If no better in 6 weeks, he will call back and we will consider Botox.    4..    Return in 6 months, sooner if new or worsening neurologic symptoms.  45 minutes face to face interaction with > 1/2 of the time counseling and coordinating care about his symptoms and work/disabiliy issues.   Janal Haak A. Felecia Shelling, MD, PhD [id number], [id number] AM Certified in Neurology, Clinical Neurophysiology, Sleep Medicine, Pain Medicine and Neuroimaging  [facility] [address], [place], [place] [address] [phone] [;

## 2015-09-10 LAB — B. BURGDORFI ANTIBODIES

## 2015-09-10 LAB — TSH: TSH: 2.12 u[IU]/mL (ref 0.450–4.500)

## 2015-09-11 ENCOUNTER — Telehealth: Payer: Self-pay

## 2015-09-11 NOTE — Telephone Encounter (Signed)
-----   Message from Britt Bottom, MD sent at 09/10/2015  6:02 PM EDT ----- Please let him know that the Lyme antibodies and the thyroid tests were both negative.

## 2015-09-11 NOTE — Telephone Encounter (Signed)
I spoke to pt and advised him that per Dr. Felecia Shelling, his lyme antibodies and thyroid tests were both negative. Pt verbalized understanding of results. Pt had no questions at this time but was encouraged to call back if questions arise.

## 2016-03-25 ENCOUNTER — Ambulatory Visit (INDEPENDENT_AMBULATORY_CARE_PROVIDER_SITE_OTHER): Payer: BLUE CROSS/BLUE SHIELD | Admitting: Neurology

## 2016-03-25 ENCOUNTER — Encounter: Payer: Self-pay | Admitting: Neurology

## 2016-03-25 VITALS — BP 128/78 | HR 72 | Resp 18 | Ht 74.0 in | Wt 183.0 lb

## 2016-03-25 DIAGNOSIS — G43709 Chronic migraine without aura, not intractable, without status migrainosus: Secondary | ICD-10-CM | POA: Diagnosis not present

## 2016-03-25 DIAGNOSIS — M791 Myalgia, unspecified site: Secondary | ICD-10-CM | POA: Insufficient documentation

## 2016-03-25 DIAGNOSIS — M542 Cervicalgia: Secondary | ICD-10-CM | POA: Diagnosis not present

## 2016-03-25 DIAGNOSIS — IMO0002 Reserved for concepts with insufficient information to code with codable children: Secondary | ICD-10-CM

## 2016-03-25 DIAGNOSIS — R5382 Chronic fatigue, unspecified: Secondary | ICD-10-CM | POA: Diagnosis not present

## 2016-03-25 DIAGNOSIS — D8989 Other specified disorders involving the immune mechanism, not elsewhere classified: Secondary | ICD-10-CM

## 2016-03-25 MED ORDER — CYCLOBENZAPRINE HCL 5 MG PO TABS: 5.0000 mg | ORAL_TABLET | Freq: Every day | ORAL | 5 refills | Status: DC

## 2016-03-25 NOTE — Progress Notes (Signed)
[facility]  PATIENT: Eduardo Lin DOB: Mar 13, 1951  REFERRING DOCTOR OR PCP:  Jesse Fall  SOURCE: patient and records form [facility]  _________________________________   HISTORICAL  CHIEF COMPLAINT:  Chief Complaint  Patient presents with  . Migraines    Sts. he is having 4 or more h/a's per week, and are more severe.  Sts. no relief with Nortriptyline 25mg  qhs. Sts. fatigue is worse./fim  . Chronic Fatigue Syndrome    HISTORY OF PRESENT ILLNESS:  Eduardo Lin is a 66 y.o.man with Migraine Headaches and Chronic Fatigue Syndrome.     He reports both issues are worse.     Migraines are occuring more frequently. Fatigue is constant now.    He feels mentally foggy.   .      Chronic Headache:   He reports frequent migraine headaches now occurring 4-5 days a week (20 days/month) for 4+ hours a day on average.,  Pain is throbbing ice pick sensation in the top of the head at times about 1/2 the week. He gets neck pain also.  Sometimes, moving will increase the pain.    He sometimes will have photophobia but does not have phonophobia.. He denies nausea or vomiting    He received some benefit with his zonisamide at  200 mg initially but now benefit seems limited.    He will use Imitrex when the headaches are more severe with benefit but HA usually comes back the next day.      Nortriptyline has also been tried but there is not much benefit.    Chronic Fatigue:    He reports chronic fatigue x many years despite 8 hours of sleep nihtly.   The fatigue is both physical and cognitive and he also feels ve achy.    He states fatigue started after surgery in 2005 and fatigue worsened in 2014. The fatigue is daily.  It is present upon awakening but usually worsens as the day goes on.    Last visit we checked Lyme titer and TSH (both normal).   We have tried several different medications to try to help including amantadine, Adderall, and Provigil, all without benefit     Many years ago, he saw an infectious disease at [facility] and [place] for the chronic fatigue but no definitive cause of the CFS was ever identified.   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  TSH, CBC, Chemistries and Vit D have been.       He sleeps fairly well at night and will usually wake up for nocturia x 2-3.   Vesicare helps.    He had a sleep study done in 2008 and had no OSA. He  does not snore.       Other pain:   He notes neck pain and generalized myalgias.   Many years ago, he tried gabapentin but could not tolerate it.    Nortriptyline has not helped the pain   Mood:   He denies feeling depressed but is frustrated by his chronic fatigue.  Marland Kitchen  He does have some apathy.  No major change in diet/weight.   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 However, he has been unable to do these higher-level jobs for the past few years due to his  chronic fatigue. He   tried  a more sedentary repetitive job at the [employer] but was unable to do his duties and left after 2 weeks.   He has difficulty completing many simple physical chores around the home.   REVIEW OF SYSTEMS: Constitutional: No fevers, chills, sweats, or change in appetite.   He has fatigue.   Sleeps 8 hours/night Eyes: No visual changes, double vision, eye pain Ear, nose and throat: No hearing loss, ear pain, nasal congestion, sore throat Cardiovascular: No chest pain, palpitations Respiratory: No shortness of breath at rest or with exertion.   No wheezes. No snoring. GastrointestinaI: No nausea, vomiting, diarrhea, abdominal pain, fecal incontinence Genitourinary: No dysuria, urinary retention or frequency.  Once nightly nocturia. Musculoskeletal: No neck pain, back pain Integumentary: No rash, pruritus, skin lesions Neurological: as  above Psychiatric: No depression at this time.  No anxiety Endocrine: No palpitations, diaphoresis, change in appetite, change in weigh or increased thirst Hematologic/Lymphatic: No anemia, purpura, petechiae. Allergic/Immunologic: No itchy/runny eyes, nasal congestion, recent allergic reactions, rashes  ALLERGIES: Allergies  Allergen Reactions  . Nickel     Other reaction(s): Other (See Comments) Other Reaction: rash on hands  . Hydrocodone-Acetaminophen     Other reaction(s): Other (See Comments) Other Reaction: hallucinate    HOME MEDICATIONS:  Current Outpatient Prescriptions:  .  Cholecalciferol (VITAMIN D3) 1000 UNITS CAPS, take 2 daily, Disp: , Rfl:  .  fluticasone (FLONASE) 50 MCG/ACT nasal spray, , Disp: , Rfl:  .  Magnesium 250 MG TABS, Take 1 daily, Disp: , Rfl:  .  nortriptyline (PAMELOR) 25 MG capsule, Take 1 capsule (25 mg total) by mouth at bedtime., Disp: 30 capsule, Rfl: 5 .  Probiotic Product (PROBIOTIC & ACIDOPHILUS EX ST PO), , Disp: , Rfl:  .  SUMAtriptan (IMITREX) 100 MG tablet, 1 pill as needed when necessary headache. May repeat in 2 hours if necessary., Disp: 30 tablet, Rfl: 3 .  UNABLE TO FIND, Over the counter Potassium 95meq daily, Disp: , Rfl:  .  VESICARE 10 MG tablet, TAKE 1 TABLET (10 MG TOTAL) BY MOUTH ONCE DAILY., Disp: , Rfl: 11 .  zonisamide (ZONEGRAN) 100 MG capsule, Take 2 capsules (200 mg total) by mouth daily., Disp: 180 capsule, Rfl: 3 .  cyclobenzaprine (FLEXERIL) 5 MG tablet, Take 1 tablet (5 mg total) by mouth at bedtime., Disp: 30 tablet, Rfl: 5  PAST MEDICAL HISTORY: Past Medical History:  Diagnosis Date  . Cancer ([place])   . Headache   . Prostate cancer ([place])   . Vision abnormalities     PAST SURGICAL HISTORY: Past Surgical History:  Procedure Laterality Date  . CHOLECYSTECTOMY, LAPAROSCOPIC    . PROSTATECTOMY    . WISDOM TOOTH EXTRACTION      FAMILY HISTORY: No family history on file.  SOCIAL HISTORY:  Social History    Social History  . Marital status: Married    Spouse name: N/A  . Number of children: N/A  . Years of education: N/A   Occupational History  . Not on file.   Social History Main Topics  . Smoking status: Never Smoker  . Smokeless tobacco: Not on file  . Alcohol use No  . Drug use: No  . Sexual activity: Not on file   Other Topics Concern  . Not on file   Social History Narrative  . No narrative on file     PHYSICAL EXAM  Vitals:   03/25/16 1034  BP: 128/78  Pulse: 72  Resp:  18  Weight: 183 lb (83 kg)  Height: 6\' 2"  (1.88 m)    Body mass index is 23.5 kg/m.   General: The patient is well-developed and well-nourished and in no acute distress   Musculoskeletal:  Neck is slightly tender with good ROM.  Back is non-tender.   Not tender over typical fibromyalgia tender points  Neurologic Exam  Mental status: The patient is alert and oriented x 3 at the time of the examination. The patient has apparent normal recent and remote memory, with an apparently normal attention span and concentration ability.   Speech is normal.  Cranial nerves: Extraocular movements are full.  Facial symmetry is present. There is good facial sensation to soft touch bilaterally.Facial strength is normal.  Trapezius and sternocleidomastoid strength is normal. No dysarthria is noted.  The tongue is midline, and the patient has symmetric elevation of the soft palate. No obvious hearing deficits are noted.  Motor:  Muscle bulk is normal.   Tone is normal. Strength is  5 / 5 in all 4 extremities.   Sensory: Sensory testing is intact to touch and vibration sensation in all 4 extremities.  Coordination: Cerebellar testing reveals good finger-nose-finger bilaterally.  Gait and station: Station is normal.   Gait is mildly wide. Tandem gait is wide. Romberg is negative.   Reflexes: Deep tendon reflexes are symmetric and normal bilaterally.  Marland Kitchen    DIAGNOSTIC DATA (LABS, IMAGING, TESTING) - I  reviewed patient records, labs, notes, testing and imaging myself where available.     ASSESSMENT AND PLAN  CFIDS (chronic fatigue and immune dysfunction syndrome) ([facility])  Chronic migraine  Myalgia  Neck pain   1.    His chronic fatigue has not responded to different medications including controlled stimulants.    He has difficulty functioning with day-to-day chores and is unable to do sedentary job. Due to his chronic fatigue, I believe that he is disabled and he will not be able to find employment.    2.     He has failed multiple different med's for chronic migraine.   Continue zonisamide.  Add  Nortriptyline 25 mg at bedtime.     We discussed Botox if they continue to worsen (has > 20 days / month).    3.    Trial of cyclobenzaprine at bedtime as it may consolidate sleep and help neck pain some.   If no better after a month, can stop.   Return in 6 months, sooner if new or worsening neurologic symptoms.   Clint Strupp A. Felecia Shelling, MD, PhD [id number], [id number] AM Certified in Neurology, Clinical Neurophysiology, Sleep Medicine, Pain Medicine and Neuroimaging  [facility] [address] [place], [place] [address] [phone] [;

## 2016-09-24 ENCOUNTER — Ambulatory Visit (INDEPENDENT_AMBULATORY_CARE_PROVIDER_SITE_OTHER): Payer: BLUE CROSS/BLUE SHIELD | Admitting: Neurology

## 2016-09-24 ENCOUNTER — Encounter: Payer: Self-pay | Admitting: Neurology

## 2016-09-24 VITALS — BP 110/70 | HR 61 | Resp 16 | Ht 74.0 in | Wt 180.0 lb

## 2016-09-24 DIAGNOSIS — H938X9 Other specified disorders of ear, unspecified ear: Secondary | ICD-10-CM | POA: Insufficient documentation

## 2016-09-24 DIAGNOSIS — M791 Myalgia, unspecified site: Secondary | ICD-10-CM

## 2016-09-24 DIAGNOSIS — M542 Cervicalgia: Secondary | ICD-10-CM

## 2016-09-24 DIAGNOSIS — D8989 Other specified disorders involving the immune mechanism, not elsewhere classified: Secondary | ICD-10-CM

## 2016-09-24 DIAGNOSIS — G43709 Chronic migraine without aura, not intractable, without status migrainosus: Secondary | ICD-10-CM

## 2016-09-24 DIAGNOSIS — H938X3 Other specified disorders of ear, bilateral: Secondary | ICD-10-CM

## 2016-09-24 DIAGNOSIS — G9332 Myalgic encephalomyelitis/chronic fatigue syndrome: Secondary | ICD-10-CM

## 2016-09-24 DIAGNOSIS — IMO0002 Reserved for concepts with insufficient information to code with codable children: Secondary | ICD-10-CM

## 2016-09-24 DIAGNOSIS — M5481 Occipital neuralgia: Secondary | ICD-10-CM | POA: Insufficient documentation

## 2016-09-24 DIAGNOSIS — R5382 Chronic fatigue, unspecified: Secondary | ICD-10-CM

## 2016-09-24 MED ORDER — SUMATRIPTAN SUCCINATE 100 MG PO TABS: ORAL_TABLET | ORAL | 3 refills | Status: DC
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
## 2016-09-28 ENCOUNTER — Telehealth: Payer: Self-pay | Admitting: Neurology

## 2016-09-28 NOTE — Telephone Encounter (Signed)
Noted, thank you

## 2016-09-28 NOTE — Telephone Encounter (Signed)
[employer] did not approve the MRI cervical. The phone number for the peer to peer is [phone]. The member ID is [id number] & DOB 1950-08-04. The case close's in 2 business days.

## 2016-09-29 NOTE — Telephone Encounter (Signed)
Eduardo Lin, I did go ahead and do appear to appear with this one as I felt an MRI is indicated.  We got approval number [id number]     Due by 10/26/16

## 2016-09-29 NOTE — Telephone Encounter (Signed)
I checked the status on this patient MRI. They informed me that it is pending and at the MD level for peer to peer. The case does close tomorrow 09/30/16 at 7:00 pm. The phone number for the peer to peer is [phone]. The member ID is [id number] &  DOB 10-05-1950.

## 2016-09-29 NOTE — Telephone Encounter (Signed)
Noted,  Thank you!

## 2016-09-30 NOTE — Telephone Encounter (Signed)
Patient wanted MRI at [facility] I faxed the order they will reach out to the patient to schedule.

## 2016-09-30 NOTE — Telephone Encounter (Signed)
Noted, thank you for your help!  °

## 2016-10-06 DIAGNOSIS — L84 Corns and callosities: Secondary | ICD-10-CM | POA: Insufficient documentation

## 2016-10-06 DIAGNOSIS — B070 Plantar wart: Secondary | ICD-10-CM | POA: Insufficient documentation

## 2016-10-06 DIAGNOSIS — L909 Atrophic disorder of skin, unspecified: Secondary | ICD-10-CM | POA: Insufficient documentation

## 2016-10-21 DIAGNOSIS — H04123 Dry eye syndrome of bilateral lacrimal glands: Secondary | ICD-10-CM | POA: Insufficient documentation

## 2016-11-02 ENCOUNTER — Telehealth: Payer: Self-pay | Admitting: Neurology

## 2016-11-02 NOTE — Telephone Encounter (Signed)
Pt called the clinic he had MRI at [facility] 10/24/16 last week and wanting results. Pt said he not answering any calls as he is getting "junk calls". Please LVM and he will return the call.

## 2016-11-02 NOTE — Telephone Encounter (Signed)
Please let him know I had a chance to look at the MRI of the cervical spine. At C4-C5 and C5-C6 he has degenerative changes that are more to the right. The spaces for the right C5 and right C6 nerve roots are crowded but there does not appear to be definite nerve root compression.     This would be more likely to cause occasional right arm than headache.

## 2016-11-03 NOTE — Telephone Encounter (Signed)
I have spoken with Eduardo Lin this morning and per RAS, reviewed MRI results as below.  He verbalized understanding of same/fim

## 2016-12-08 ENCOUNTER — Encounter: Payer: Self-pay | Admitting: *Deleted

## 2017-03-10 DIAGNOSIS — M2241 Chondromalacia patellae, right knee: Secondary | ICD-10-CM | POA: Insufficient documentation

## 2017-03-10 DIAGNOSIS — M7651 Patellar tendinitis, right knee: Secondary | ICD-10-CM | POA: Insufficient documentation

## 2017-03-29 ENCOUNTER — Ambulatory Visit (INDEPENDENT_AMBULATORY_CARE_PROVIDER_SITE_OTHER): Payer: BLUE CROSS/BLUE SHIELD | Admitting: Neurology

## 2017-03-29 ENCOUNTER — Encounter: Payer: Self-pay | Admitting: Neurology

## 2017-03-29 ENCOUNTER — Other Ambulatory Visit: Payer: Self-pay

## 2017-03-29 VITALS — BP 121/73 | HR 72 | Resp 18 | Ht 74.0 in | Wt 178.5 lb

## 2017-03-29 DIAGNOSIS — IMO0002 Reserved for concepts with insufficient information to code with codable children: Secondary | ICD-10-CM

## 2017-03-29 DIAGNOSIS — D8989 Other specified disorders involving the immune mechanism, not elsewhere classified: Secondary | ICD-10-CM

## 2017-03-29 DIAGNOSIS — R0789 Other chest pain: Secondary | ICD-10-CM | POA: Diagnosis not present

## 2017-03-29 DIAGNOSIS — G43709 Chronic migraine without aura, not intractable, without status migrainosus: Secondary | ICD-10-CM | POA: Diagnosis not present

## 2017-03-29 DIAGNOSIS — R5382 Chronic fatigue, unspecified: Secondary | ICD-10-CM

## 2017-03-29 DIAGNOSIS — G43009 Migraine without aura, not intractable, without status migrainosus: Secondary | ICD-10-CM

## 2017-03-29 DIAGNOSIS — G9332 Myalgic encephalomyelitis/chronic fatigue syndrome: Secondary | ICD-10-CM
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
## 2017-04-07 ENCOUNTER — Telehealth: Payer: Self-pay | Admitting: Neurology

## 2017-04-07 NOTE — Telephone Encounter (Signed)
Tillie Rung with [facility] called stating this patients echo is requiring an authorization. It needs to be done by today before 2:00. Her number is [phone] ext [phone].

## 2017-04-07 NOTE — Telephone Encounter (Signed)
I have sent this to [facility] as urgent as Nurse Review. [facility] is aware patient's apt is 04/07/2017 . Telephone [phone]- fax [phone].   Echocardiogram 93306 -  R 07.89

## 2017-04-08 ENCOUNTER — Ambulatory Visit (HOSPITAL_COMMUNITY)
Admission: RE | Admit: 2017-04-08 | Discharge: 2017-04-08 | Disposition: A | Discharge status: Home / Self Care | Payer: BLUE CROSS/BLUE SHIELD | Source: Ambulatory Visit | Attending: Neurology | Admitting: Neurology

## 2017-04-08 DIAGNOSIS — R5382 Chronic fatigue, unspecified: Secondary | ICD-10-CM | POA: Diagnosis present

## 2017-04-08 DIAGNOSIS — I071 Rheumatic tricuspid insufficiency: Secondary | ICD-10-CM | POA: Diagnosis not present

## 2017-04-08 DIAGNOSIS — R0789 Other chest pain: Secondary | ICD-10-CM | POA: Diagnosis not present

## 2017-04-08 NOTE — Progress Notes (Signed)
  Echocardiogram  has been performed.  Eduardo Lin 04/08/2017, 3:38 PM

## 2017-04-08 NOTE — Telephone Encounter (Signed)
Butch Penny at [facility] is asking for a call back from [place] re: appointment today @ 3:00 for pt.  The call back request is re: Pre certification, please call Butch Penny @ [phone]

## 2017-04-13 ENCOUNTER — Telehealth: Payer: Self-pay | Admitting: *Deleted

## 2017-04-13 NOTE — Telephone Encounter (Signed)
-----   Message from Britt Bottom, MD sent at 04/12/2017  2:30 PM EST ----- Please let him know that the echocardiogram was normal.   There is no evidence of any problems with the bowel or any large intracardiac shunt.

## 2017-04-13 NOTE — Telephone Encounter (Signed)
Spoke to pt and relayed that the results of the echocardiogram was normal. No evidence of any problems with the bowel or any large intracardiac shunt.  He was glad it was ok.

## 2017-08-03 DIAGNOSIS — M47814 Spondylosis without myelopathy or radiculopathy, thoracic region: Secondary | ICD-10-CM | POA: Insufficient documentation

## 2017-09-21 ENCOUNTER — Telehealth: Payer: Self-pay | Admitting: Neurology

## 2017-09-27 ENCOUNTER — Encounter: Payer: Self-pay | Admitting: Neurology

## 2017-09-27 ENCOUNTER — Telehealth: Payer: Self-pay | Admitting: Neurology

## 2017-09-27 ENCOUNTER — Other Ambulatory Visit: Payer: Self-pay

## 2017-09-27 ENCOUNTER — Ambulatory Visit (INDEPENDENT_AMBULATORY_CARE_PROVIDER_SITE_OTHER): Payer: BLUE CROSS/BLUE SHIELD | Admitting: Neurology

## 2017-09-27 VITALS — BP 106/76 | HR 66 | Resp 18 | Ht 74.0 in | Wt 174.0 lb

## 2017-09-27 DIAGNOSIS — IMO0002 Reserved for concepts with insufficient information to code with codable children: Secondary | ICD-10-CM

## 2017-09-27 DIAGNOSIS — M791 Myalgia, unspecified site: Secondary | ICD-10-CM

## 2017-09-27 DIAGNOSIS — R5382 Chronic fatigue, unspecified: Secondary | ICD-10-CM | POA: Diagnosis not present

## 2017-09-27 DIAGNOSIS — R9089 Other abnormal findings on diagnostic imaging of central nervous system: Secondary | ICD-10-CM

## 2017-09-27 DIAGNOSIS — H81313 Aural vertigo, bilateral: Secondary | ICD-10-CM

## 2017-09-27 DIAGNOSIS — R51 Headache: Secondary | ICD-10-CM | POA: Diagnosis not present

## 2017-09-27 DIAGNOSIS — E559 Vitamin D deficiency, unspecified: Secondary | ICD-10-CM

## 2017-09-27 DIAGNOSIS — R519 Headache, unspecified: Secondary | ICD-10-CM

## 2017-09-27 DIAGNOSIS — G43709 Chronic migraine without aura, not intractable, without status migrainosus: Secondary | ICD-10-CM

## 2017-09-27 DIAGNOSIS — M542 Cervicalgia: Secondary | ICD-10-CM

## 2017-09-27 NOTE — Telephone Encounter (Signed)
Medicare/BCBS Auth: [id number] (exp. 09/27/17 to 10/26/17) order sent to GI. They will reach out to the pt to schedule.
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
## 2017-09-27 NOTE — Telephone Encounter (Signed)
error 

## 2017-09-28 ENCOUNTER — Telehealth: Payer: Self-pay | Admitting: *Deleted

## 2017-09-28 LAB — CK: Total CK: 102 U/L (ref 24–204)

## 2017-09-28 LAB — COMPREHENSIVE METABOLIC PANEL
ALBUMIN: 4.3 g/dL (ref 3.6–4.8)
ALT: 17 IU/L (ref 0–44)
AST: 19 IU/L (ref 0–40)
Albumin/Globulin Ratio: 1.9 (ref 1.2–2.2)
Alkaline Phosphatase: 57 IU/L (ref 39–117)
BILIRUBIN TOTAL: 1.1 mg/dL (ref 0.0–1.2)
BUN/Creatinine Ratio: 15 (ref 10–24)
BUN: 13 mg/dL (ref 8–27)
CO2: 26 mmol/L (ref 20–29)
Calcium: 9.2 mg/dL (ref 8.6–10.2)
Chloride: 103 mmol/L (ref 96–106)
Creatinine, Ser: 0.87 mg/dL (ref 0.76–1.27)
GFR calc non Af Amer: 91 mL/min/{1.73_m2} (ref >59)
GFR, EST AFRICAN AMERICAN: 105 mL/min/{1.73_m2} (ref >59)
Globulin, Total: 2.3 g/dL (ref 1.5–4.5)
Glucose: 86 mg/dL (ref 65–99)
Potassium: 5.4 mmol/L — ABNORMAL HIGH (ref 3.5–5.2)
Sodium: 141 mmol/L (ref 134–144)
TOTAL PROTEIN: 6.6 g/dL (ref 6.0–8.5)

## 2017-09-28 LAB — CBC WITH DIFFERENTIAL/PLATELET
BASOS ABS: 0.1 10*3/uL (ref 0.0–0.2)
BASOS: 1 %
EOS (ABSOLUTE): 0.2 10*3/uL (ref 0.0–0.4)
Eos: 3 %
Hematocrit: 47.2 % (ref 37.5–51.0)
Hemoglobin: 15.4 g/dL (ref 13.0–17.7)
IMMATURE GRANS (ABS): 0 10*3/uL (ref 0.0–0.1)
IMMATURE GRANULOCYTES: 0 %
LYMPHS: 34 %
Lymphocytes Absolute: 3 10*3/uL (ref 0.7–3.1)
MCH: 28.8 pg (ref 26.6–33.0)
MCHC: 32.6 g/dL (ref 31.5–35.7)
MCV: 88 fL (ref 79–97)
MONOS ABS: 0.5 10*3/uL (ref 0.1–0.9)
Monocytes: 6 %
NEUTROS PCT: 56 %
Neutrophils Absolute: 5 10*3/uL (ref 1.4–7.0)
PLATELETS: 245 10*3/uL (ref 150–450)
RBC: 5.34 x10E6/uL (ref 4.14–5.80)
RDW: 13.2 % (ref 12.3–15.4)
WBC: 8.8 10*3/uL (ref 3.4–10.8)

## 2017-09-28 LAB — TESTOSTERONE: Testosterone: 691 ng/dL (ref 264–916)

## 2017-09-28 LAB — SEDIMENTATION RATE: SED RATE: 2 mm/h (ref 0–30)

## 2017-09-28 LAB — VITAMIN D 25 HYDROXY (VIT D DEFICIENCY, FRACTURES): VIT D 25 HYDROXY: 30.9 ng/mL (ref 30.0–100.0)

## 2017-09-28 LAB — MAGNESIUM: MAGNESIUM: 2.3 mg/dL (ref 1.6–2.3)

## 2017-09-28 NOTE — Telephone Encounter (Signed)
[place] if he still has a question/fim

## 2017-09-28 NOTE — Telephone Encounter (Signed)
LMOM with below lab results and rec. to take otc vit. d 5,000iu once daily; please call with any questions/concerns/fim

## 2017-09-28 NOTE — Telephone Encounter (Signed)
Pt returning RNs call, stating he would like to discuss results in more detail and states he has a questions regarding the results. Please call to advise

## 2017-09-28 NOTE — Telephone Encounter (Signed)
Pt called RN back, he will try back again in awhile

## 2017-09-28 NOTE — Telephone Encounter (Signed)
Attempted to contact pt.  It sounded as if phone was picked up, but nobody answered./fim

## 2017-09-28 NOTE — Telephone Encounter (Signed)
-----   Message from Britt Bottom, MD sent at 09/28/2017  9:55 AM EDT ----- Please let him know that the vitamin D was low normal and he should supplement this with 5000 units OTC daily.  Other laboratory tests were normal.

## 2017-09-28 NOTE — Telephone Encounter (Signed)
Pt requesting RNs call, pts phone currently not able to answer calls. Pt will try again shortly

## 2017-09-29 NOTE — Telephone Encounter (Signed)
Spoke with [employer]. He sts. issues have been resolved; he does not have further questions/fim

## 2017-09-29 NOTE — Telephone Encounter (Signed)
Pt returning RN's call.

## 2017-10-05 ENCOUNTER — Ambulatory Visit
Admission: RE | Admit: 2017-10-05 | Discharge: 2017-10-05 | Disposition: A | Discharge status: Home / Self Care | Payer: BLUE CROSS/BLUE SHIELD | Source: Ambulatory Visit | Attending: Neurology | Admitting: Neurology

## 2017-10-05 DIAGNOSIS — R51 Headache: Principal | ICD-10-CM

## 2017-10-05 DIAGNOSIS — R519 Headache, unspecified: Secondary | ICD-10-CM

## 2017-10-06 ENCOUNTER — Telehealth: Payer: Self-pay | Admitting: *Deleted

## 2017-10-06 NOTE — Telephone Encounter (Signed)
LMOM with below MRI report.  Please call if he has questions, or if he has sx. of a sinus infection and would like Z-pk. sent in/fim

## 2017-10-06 NOTE — Telephone Encounter (Signed)
-----   Message from Britt Bottom, MD sent at 10/06/2017  2:17 PM EDT ----- Regarding: MRI results Please let him know that I looked at the MRI and compared it side-by-side to his earlier MRI.  There are no changes.  He continues to have some small spots under the surface of the brain but there are no new ones.    These are nonspecific and most likely represent mild chronic age-related changes.  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  This is usually not symptomatic but if he has sinusitis symptoms we can call in a Z-Pak.

## 2017-10-07 MED ORDER — CLARITHROMYCIN 500 MG PO TABS: 500.0000 mg | ORAL_TABLET | Freq: Two times a day (BID) | ORAL | 0 refills | Status: DC

## 2017-10-07 NOTE — Telephone Encounter (Signed)
Per RAS, ok for Clarithromycin.  Rx[facility] escrib[facility] to [facility] as requested/fim

## 2017-10-07 NOTE — Addendum Note (Signed)
Addended by: France Ravens I on: 10/07/2017 10:31 AM   Modules accepted: Orders

## 2017-10-07 NOTE — Telephone Encounter (Signed)
Pt has called back stating yes he does have a sinus infection, pt would like: Clarithromycin 500 mg tablets called in for him into  [id number] - [place], [place] [address], [address] AT [facility] [address] [phone] (Phone) [phone] (Fax)      This has helped pt before in the past. Previously prescribed by Dr Lenna [place].

## 2017-11-11 DIAGNOSIS — R5381 Other malaise: Secondary | ICD-10-CM | POA: Insufficient documentation

## 2017-11-11 DIAGNOSIS — M6281 Muscle weakness (generalized): Secondary | ICD-10-CM | POA: Insufficient documentation

## 2018-03-31 ENCOUNTER — Ambulatory Visit (INDEPENDENT_AMBULATORY_CARE_PROVIDER_SITE_OTHER): Payer: BLUE CROSS/BLUE SHIELD | Admitting: Neurology

## 2018-03-31 ENCOUNTER — Other Ambulatory Visit: Payer: Self-pay

## 2018-03-31 ENCOUNTER — Encounter: Payer: Self-pay | Admitting: Neurology

## 2018-03-31 VITALS — BP 110/70 | HR 68 | Resp 18 | Wt 178.5 lb

## 2018-03-31 DIAGNOSIS — M791 Myalgia, unspecified site: Secondary | ICD-10-CM

## 2018-03-31 DIAGNOSIS — R5382 Chronic fatigue, unspecified: Secondary | ICD-10-CM

## 2018-03-31 DIAGNOSIS — G43709 Chronic migraine without aura, not intractable, without status migrainosus: Secondary | ICD-10-CM

## 2018-03-31 DIAGNOSIS — E559 Vitamin D deficiency, unspecified: Secondary | ICD-10-CM

## 2018-03-31 DIAGNOSIS — R253 Fasciculation: Secondary | ICD-10-CM

## 2018-03-31 DIAGNOSIS — IMO0002 Reserved for concepts with insufficient information to code with codable children: Secondary | ICD-10-CM

## 2018-03-31 DIAGNOSIS — G43019 Migraine without aura, intractable, without status migrainosus: Secondary | ICD-10-CM | POA: Diagnosis not present

## 2018-03-31 DIAGNOSIS — M542 Cervicalgia: Secondary | ICD-10-CM

## 2018-03-31 DIAGNOSIS — D8989 Other specified disorders involving the immune mechanism, not elsewhere classified: Secondary | ICD-10-CM

## 2018-03-31 MED ORDER — SUMATRIPTAN SUCCINATE 100 MG PO TABS: ORAL_TABLET | ORAL | 3 refills | Status: DC
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
## 2018-04-01 ENCOUNTER — Telehealth: Payer: Self-pay | Admitting: *Deleted

## 2018-04-01 LAB — VITAMIN D 25 HYDROXY (VIT D DEFICIENCY, FRACTURES): VIT D 25 HYDROXY: 70.7 ng/mL (ref 30.0–100.0)

## 2018-04-01 NOTE — Telephone Encounter (Signed)
Called, LVM for pt about lab results per Dr. Felecia Shelling note. Gave [facility] phone number if he has further questions/concerns.

## 2018-04-01 NOTE — Telephone Encounter (Signed)
-----   Message from Britt Bottom, MD sent at 04/01/2018  9:29 AM EST ----- Let her know that the vitamin D is fine.  It is well into the normal range.

## 2018-05-03 ENCOUNTER — Ambulatory Visit (INDEPENDENT_AMBULATORY_CARE_PROVIDER_SITE_OTHER): Payer: BLUE CROSS/BLUE SHIELD | Admitting: Neurology

## 2018-05-03 DIAGNOSIS — M791 Myalgia, unspecified site: Secondary | ICD-10-CM

## 2018-05-03 DIAGNOSIS — R5382 Chronic fatigue, unspecified: Secondary | ICD-10-CM

## 2018-05-03 DIAGNOSIS — R253 Fasciculation: Secondary | ICD-10-CM

## 2018-05-03 DIAGNOSIS — D8989 Other specified disorders involving the immune mechanism, not elsewhere classified: Secondary | ICD-10-CM

## 2018-05-03 DIAGNOSIS — M5431 Sciatica, right side: Secondary | ICD-10-CM

## 2018-05-03 DIAGNOSIS — R519 Headache, unspecified: Secondary | ICD-10-CM

## 2018-05-03 DIAGNOSIS — G8929 Other chronic pain: Secondary | ICD-10-CM

## 2018-05-03 DIAGNOSIS — R51 Headache: Secondary | ICD-10-CM

## 2018-05-03 MED ORDER — MELOXICAM 7.5 MG PO TABS: 7.5000 mg | ORAL_TABLET | Freq: Every day | ORAL | 5 refills | Status: DC

## 2018-05-03 MED ORDER — SOLIFENACIN SUCCINATE 5 MG PO TABS: 5.0000 mg | ORAL_TABLET | Freq: Every day | ORAL | 11 refills | Status: DC
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
## 2018-05-03 NOTE — Progress Notes (Signed)
Full Name: Eduardo Lin Gender: Male MRN #: [id number] Date of Birth: 07/11/1950    Visit Date: 05/03/2018 13:36 Age: 68 Years 33 Months Old Examining Physician: Arlice Colt, MD  Referring Physician: Arlice Colt, MD     History:  Mr. Hodes is a 68 year old man with a long history of muscle fatigue.  More recently he has noted fasciculations in the right calf muscle and elsewhere in the right leg.  He has a history of right leg sciatica and was told he had degenerative changes at L4-L5.  He is not noting much back or leg pain right now.  Nerve conduction studies: The right median, ulnar, peroneal and tibial motor responses had normal distal latencies, amplitudes and conduction velocities.  F-wave responses were borderline prolonged (though he is tall so this would be within normal limits).  The right median, ulnar, sural and superficial peroneal sensory responses had normal peak latencies and amplitudes.  Electromyography: Needle EMG of selected muscles of the right leg showed mild chronic denervation in the muscles innervated by L5 and S1.  Other muscles tested were normal.  There was no abnormal spontaneous activity.  There were no myopathic units.  Impression: This NCV/EMG study shows the following: 1.   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 2.   No evidence of superimposed motor neuron disease, neuropathy or myopathy.  Kanoe Wanner A. Felecia Shelling, MD, PhD, FAAN Certified in Neurology, Clinical Neurophysiology, Sleep Medicine, Pain Medicine and Neuroimaging Director, [facility] [address] [place], [place] [address] [phone]         [facility]    Nerve / Sites Muscle Latency Ref. Amplitude Ref. Rel Amp Segments Distance Velocity Ref. Area    ms ms mV mV %  cm m/s m/s mVms  R Median - APB     Wrist APB 3.9 ?4.4 9.8 ?4.0 100 Wrist - APB 7   31.6     Upper arm APB 8.6   9.4  95.6 Upper arm - Wrist 25 53 ?49 31.0  R Ulnar - ADM     Wrist ADM 3.2 ?3.3 10.0 ?6.0 100 Wrist - ADM 7   40.6     B.Elbow ADM 7.7  8.8  88.2 B.Elbow - Wrist 23 52 ?49 38.0     A.Elbow ADM 9.6  7.9  89.6 A.Elbow - B.Elbow 10 51 ?49 34.8         A.Elbow - Wrist      R Peroneal - EDB     Ankle EDB 4.7 ?6.5 5.7 ?2.0 100 Ankle - EDB 9   20.7     Fib head EDB 12.1  4.8  84.5 Fib head - Ankle 32 44 ?44 19.5     Pop fossa EDB 14.4  4.3  87.7 Pop fossa - Fib head 10 44 ?44 18.6         Pop fossa - Ankle      R Tibial - AH     Ankle AH 4.4 ?5.8 6.6 ?4.0 100 Ankle - AH 9   16.9     Pop fossa AH 14.5  6.3  94.7 Pop fossa - Ankle 41 41 ?41 15.6             SNC    Nerve / Sites Rec. Site Peak Lat Ref.  Amp Ref. Segments Distance    ms ms V V  cm  R Sural -  Ankle (Calf)     Calf Ankle 3.6 ?4.4 7 ?6 Calf - Ankle 14  R Superficial peroneal - Ankle     Lat leg Ankle 4.1 ?4.4 9 ?6 Lat leg - Ankle 14  R Median - Orthodromic (Dig II, Mid palm)     Dig II Wrist 3.3 ?3.4 22 ?10 Dig II - Wrist 13  R Ulnar - Orthodromic, (Dig V, Mid palm)     Dig V Wrist 3.1 ?3.1 12 ?5 Dig V - Wrist 73              F  Wave    Nerve F Lat Ref.   ms ms  R Tibial - AH 62.2 ?56.0  R Ulnar - ADM 32.9 ?32.0         EMG full       EMG Summary Table    Spontaneous MUAP Recruitment  Muscle IA Fib PSW Fasc Other Amp Dur. Poly Pattern  R. Vastus medialis Normal None None None _______ Normal Normal Normal Normal  R. Tibialis anterior Normal None None None _______ Increased Increased 2+ Reduced  R. Peroneus longus Normal None None None _______ Increased Increased 1+ Reduced  R. Gastrocnemius (Medial head) Normal None None None _______ Normal Increased 1+ Reduced  R. Gluteus medius Normal None None None _______ Normal Normal 1+ Reduced  R. Iliopsoas Normal None None None _______ Normal Normal Normal Normal

## 2018-09-29 ENCOUNTER — Ambulatory Visit: Payer: Medicare Other | Admitting: Neurology

## 2019-03-20 ENCOUNTER — Encounter

## 2019-03-20 ENCOUNTER — Encounter: Payer: Self-pay | Admitting: Neurology

## 2019-03-20 ENCOUNTER — Other Ambulatory Visit: Payer: Self-pay

## 2019-03-20 ENCOUNTER — Ambulatory Visit (INDEPENDENT_AMBULATORY_CARE_PROVIDER_SITE_OTHER): Payer: BC Managed Care – PPO | Admitting: Neurology

## 2019-03-20 VITALS — BP 109/71 | HR 76 | Temp 97.2°F | Ht 74.0 in | Wt 179.0 lb

## 2019-03-20 DIAGNOSIS — E559 Vitamin D deficiency, unspecified: Secondary | ICD-10-CM | POA: Diagnosis not present

## 2019-03-20 DIAGNOSIS — D8989 Other specified disorders involving the immune mechanism, not elsewhere classified: Secondary | ICD-10-CM

## 2019-03-20 DIAGNOSIS — IMO0002 Reserved for concepts with insufficient information to code with codable children: Secondary | ICD-10-CM

## 2019-03-20 DIAGNOSIS — G43709 Chronic migraine without aura, not intractable, without status migrainosus: Secondary | ICD-10-CM

## 2019-03-20 DIAGNOSIS — G43009 Migraine without aura, not intractable, without status migrainosus: Secondary | ICD-10-CM

## 2019-03-20 DIAGNOSIS — M791 Myalgia, unspecified site: Secondary | ICD-10-CM

## 2019-03-20 DIAGNOSIS — R5382 Chronic fatigue, unspecified: Secondary | ICD-10-CM

## 2019-03-20 DIAGNOSIS — R253 Fasciculation: Secondary | ICD-10-CM

## 2019-03-20 DIAGNOSIS — R9089 Other abnormal findings on diagnostic imaging of central nervous system: Secondary | ICD-10-CM

## 2019-03-20 MED ORDER — SUMATRIPTAN SUCCINATE 100 MG PO TABS: ORAL_TABLET | ORAL | 3 refills | Status: DC
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
## 2019-03-21 ENCOUNTER — Telehealth: Payer: Self-pay | Admitting: *Deleted

## 2019-03-21 LAB — COMPREHENSIVE METABOLIC PANEL
ALT: 13 IU/L (ref 0–44)
AST: 19 IU/L (ref 0–40)
Albumin/Globulin Ratio: 1.8 (ref 1.2–2.2)
Albumin: 4.2 g/dL (ref 3.8–4.8)
Alkaline Phosphatase: 67 IU/L (ref 39–117)
BUN/Creatinine Ratio: 13 (ref 10–24)
BUN: 13 mg/dL (ref 8–27)
Bilirubin Total: 0.7 mg/dL (ref 0.0–1.2)
CO2: 26 mmol/L (ref 20–29)
Calcium: 9.3 mg/dL (ref 8.6–10.2)
Chloride: 101 mmol/L (ref 96–106)
Creatinine, Ser: 0.99 mg/dL (ref 0.76–1.27)
GFR calc Af Amer: 91 mL/min/{1.73_m2} (ref >59)
GFR calc non Af Amer: 79 mL/min/{1.73_m2} (ref >59)
Globulin, Total: 2.4 g/dL (ref 1.5–4.5)
Glucose: 80 mg/dL (ref 65–99)
Potassium: 5 mmol/L (ref 3.5–5.2)
Sodium: 139 mmol/L (ref 134–144)
Total Protein: 6.6 g/dL (ref 6.0–8.5)

## 2019-03-21 LAB — CBC WITH DIFFERENTIAL/PLATELET
Basophils Absolute: 0.1 10*3/uL (ref 0.0–0.2)
Basos: 1 %
EOS (ABSOLUTE): 0.3 10*3/uL (ref 0.0–0.4)
Eos: 3 %
Hematocrit: 45.7 % (ref 37.5–51.0)
Hemoglobin: 15.2 g/dL (ref 13.0–17.7)
Immature Grans (Abs): 0 10*3/uL (ref 0.0–0.1)
Immature Granulocytes: 0 %
Lymphocytes Absolute: 2.7 10*3/uL (ref 0.7–3.1)
Lymphs: 30 %
MCH: 29.5 pg (ref 26.6–33.0)
MCHC: 33.3 g/dL (ref 31.5–35.7)
MCV: 89 fL (ref 79–97)
Monocytes Absolute: 0.6 10*3/uL (ref 0.1–0.9)
Monocytes: 6 %
Neutrophils Absolute: 5.4 10*3/uL (ref 1.4–7.0)
Neutrophils: 60 %
Platelets: 244 10*3/uL (ref 150–450)
RBC: 5.16 x10E6/uL (ref 4.14–5.80)
RDW: 13.1 % (ref 11.6–15.4)
WBC: 9 10*3/uL (ref 3.4–10.8)

## 2019-03-21 LAB — TSH: TSH: 2.31 u[IU]/mL (ref 0.450–4.500)

## 2019-03-21 LAB — VITAMIN D 25 HYDROXY (VIT D DEFICIENCY, FRACTURES): Vit D, 25-Hydroxy: 51.3 ng/mL (ref 30.0–100.0)

## 2019-03-21 NOTE — Telephone Encounter (Signed)
Called and spoke with pt about lab results per Dr. Felecia Shelling note. He verbalized understanding. I relayed his Vit D was 51.3 (normal range 30-100). He will pursue second opinion via  [facility] or [facility]. He will call back if any further questions/concerns.

## 2019-03-21 NOTE — Telephone Encounter (Signed)
-----   Message from Britt Bottom, MD sent at 03/21/2019  8:48 AM EST ----- Please let the patient know that the lab work is fine.

## 2019-03-27 ENCOUNTER — Telehealth: Payer: Self-pay | Admitting: Neurology

## 2019-03-27 MED ORDER — METHYLPREDNISOLONE 4 MG PO TBPK: ORAL_TABLET | ORAL | 0 refills | Status: DC

## 2019-03-27 NOTE — Telephone Encounter (Signed)
Called, LVM (ok per DPR) relaying Dr. Garth Bigness recommendation. I escribed [facility] on [facility]. Asked him to call back if he has any further questions/concerns.

## 2019-03-27 NOTE — Telephone Encounter (Signed)
Patient called in and stated when he woke up Thursday he had intense pain in the back of his neck , when he turns his neck it sound like grinding sounds and now they have turned into popping sounds.

## 2019-03-27 NOTE — Telephone Encounter (Signed)
Lets send in a 6 day steroid pack and if pain is not better in a few days he can call back and we will check an x-ray of the neck and start an NSAID.

## 2019-05-16 DIAGNOSIS — M79671 Pain in right foot: Secondary | ICD-10-CM | POA: Insufficient documentation

## 2019-05-16 DIAGNOSIS — S93601A Unspecified sprain of right foot, initial encounter: Secondary | ICD-10-CM | POA: Insufficient documentation

## 2019-07-24 ENCOUNTER — Telehealth: Payer: Self-pay | Admitting: Neurology

## 2019-07-24 MED ORDER — METHYLPREDNISOLONE 4 MG PO TBPK: ORAL_TABLET | ORAL | 0 refills | Status: DC

## 2019-07-24 NOTE — Telephone Encounter (Signed)
We can send in a steroid pack.  After the steroid pack is done he can take OTC naproxen 1 p.o. twice daily for couple weeks to see if that can help.

## 2019-07-24 NOTE — Telephone Encounter (Signed)
Pt called stating that he is experiencing some nausea and grinding feeling's at the base of his neck. Pt would like to know if something can be called in or if a test can be ordered for him. Please advise.

## 2019-07-24 NOTE — Telephone Encounter (Signed)
Dr. Sater- what would you recommend? 

## 2019-07-24 NOTE — Telephone Encounter (Signed)
Called, LVM for pt letting him know we are calling [facility]. Relayed once he finishes this, he can take OTC naproxen 1 tablet po BID for a couple weeks per Dr. Felecia Shelling. Asked him to call back if he has any further questions or concerns.

## 2019-07-24 NOTE — Telephone Encounter (Signed)
Took call from phone staff and spoke with pt. He wanted to know if Dr. Felecia Shelling wanted to order any imaging for his ongoing sx. I placed him on hold and spoke with MD. Per Dr. Felecia Shelling, he would like him to try steroid pack again first. If sx do not improve after finishing, he should call back. We will then order xray neck. I relayed this to the pt. He verbalized understanding.

## 2019-08-08 ENCOUNTER — Telehealth: Payer: Self-pay | Admitting: Neurology

## 2019-08-08 DIAGNOSIS — M542 Cervicalgia: Secondary | ICD-10-CM

## 2019-08-08 DIAGNOSIS — M791 Myalgia, unspecified site: Secondary | ICD-10-CM

## 2019-08-08 NOTE — Telephone Encounter (Signed)
Pt called requesting a CB to discuss dizziness an neck pain pt states he has pain at the top of his neck at the base of his skull. Pt states he has ringing in his ears and has also been experiencing dizzy and balance issues. Pt states he has also been feeling weak and has been taking Advil for the pain

## 2019-08-08 NOTE — Telephone Encounter (Signed)
Dr. Sater- what would you recommend? 

## 2019-08-08 NOTE — Telephone Encounter (Signed)
Called pt back and relayed per MD that he would like to order xray next to further evaluate sx. He will call insurance first to see who is in network for insurance for imaging locations. He will call back in the morning with this information. He is aware once we have this, we will place order.

## 2019-08-08 NOTE — Telephone Encounter (Signed)
Last steroid pack sent in 07/24/2019. There was mention that if sx did not improve, MD may order xray to further evaluate. I will speak with MD to clarify

## 2019-08-08 NOTE — Telephone Encounter (Signed)
We did a 6-day steroid pack in January when he had recent neck pain symptoms.  We can call in another one.  I am not sure what is dizziness.  He should make sure to stay hydrated.

## 2019-08-09 ENCOUNTER — Other Ambulatory Visit: Payer: Self-pay | Admitting: *Deleted

## 2019-08-09 DIAGNOSIS — M791 Myalgia, unspecified site: Secondary | ICD-10-CM

## 2019-08-09 DIAGNOSIS — M542 Cervicalgia: Secondary | ICD-10-CM

## 2019-08-09 NOTE — Telephone Encounter (Signed)
Faxed order x 2

## 2019-08-09 NOTE — Telephone Encounter (Signed)
Pt called back to provide imaging location  [facility]  located at: [address] [place] [facility] [phone] Fax#[phone]

## 2019-08-09 NOTE — Telephone Encounter (Signed)
X- Ray Order has been sent to [facility] [phone] - fax [phone]

## 2019-08-09 NOTE — Telephone Encounter (Signed)
Noted, placed order requesting this location.

## 2019-08-09 NOTE — Progress Notes (Signed)
Took call from phone staff and spoke with Otila Kluver at [facility] imaging center. Order for xray neck entered wrong. Requested we send new order for dg cervical complete, pt there now. I faxed printed/signed order to them at [phone]. Received fax confirmation.

## 2019-08-09 NOTE — Addendum Note (Signed)
Addended by: Wyvonnia Lora on: 08/09/2019 10:54 AM   Modules accepted: Orders

## 2019-08-16 NOTE — Telephone Encounter (Signed)
Called pt back. Relayed Dr. Guadelupe Sabin message. He verbalized understanding. He will wait for Dr. Garth Bigness recommendation next week. Ok to leave detailed message per pt when we call back next week.

## 2019-08-16 NOTE — Telephone Encounter (Signed)
I reviewed the report on patient's neck x-ray.  He had a x-ray cervical spine in 4+ views through [facility] on 08/09/2019: Impression: Diffuse multilevel degenerative change.  No acute abnormality is identified.   Please advise patient that his neck x-ray shows degenerative changes which are most likely chronic, no acute or severe abnormality was seen, he may benefit from getting a neck MRI done but I would like for Dr. Felecia Shelling to be able to weigh in.  If the patient is agreeable, we can review the need for a further evaluation of his neck pain with a neck MRI upon Dr. Garth Bigness return.  If he has any severe escalation of his neck pain in the interim or sudden changes in his symptoms he will need to go to the emergency room for more immediate evaluation.

## 2019-08-16 NOTE — Telephone Encounter (Signed)
Pt called to check on his MRI results. Pt also states he is still having issues and is needing to take advil on a regular basis to help

## 2019-08-16 NOTE — Telephone Encounter (Signed)
Eduardo Lin- can you see if you can get the results for xray neck from [facility] imaging center? I do not see anything in epic

## 2019-08-24 NOTE — Telephone Encounter (Signed)
Dr. Sater- please advise 

## 2019-08-24 NOTE — Telephone Encounter (Signed)
Pt has called to f/u with Emma,RN to see if she has heard from Dr Felecia Shelling as to what he will do.

## 2019-08-25 ENCOUNTER — Other Ambulatory Visit: Payer: Self-pay | Admitting: Neurology

## 2019-08-25 ENCOUNTER — Telehealth: Payer: Self-pay | Admitting: Neurology

## 2019-08-25 MED ORDER — MELOXICAM 7.5 MG PO TABS: 7.5000 mg | ORAL_TABLET | Freq: Every day | ORAL | 5 refills | Status: DC

## 2019-08-25 NOTE — Telephone Encounter (Signed)
I called to go over the results of the cervical spine x-ray.  And was immediately sent to voicemail.  A message was left

## 2019-08-25 NOTE — Telephone Encounter (Signed)
I spoke to Eduardo Lin about the x-ray.  It shows some degenerative changes but no acute findings.  MRI of the cervical spine from 2018 was also reviewed.  It shows degenerative changes at C4-C5 and C5-C6 causing right foraminal narrowing but nothing that looks bad enough to send to surgery  He also is reporting tinnitus and a sensation of vertigo.  To help with the neck pain I will call in a prescription of meloxicam.  I recommended that he consider trying Lipoflavinoid to see if that helps the tinnitus.  If not, he should follow-up with ENT.

## 2019-09-04 ENCOUNTER — Other Ambulatory Visit: Payer: Self-pay | Admitting: Neurology

## 2019-09-04 DIAGNOSIS — M542 Cervicalgia: Secondary | ICD-10-CM

## 2019-09-04 DIAGNOSIS — G8929 Other chronic pain: Secondary | ICD-10-CM

## 2019-09-04 DIAGNOSIS — R42 Dizziness and giddiness: Secondary | ICD-10-CM

## 2019-09-05 ENCOUNTER — Telehealth: Payer: Self-pay | Admitting: Neurology

## 2019-09-05 DIAGNOSIS — R42 Dizziness and giddiness: Secondary | ICD-10-CM

## 2019-09-05 DIAGNOSIS — R519 Headache, unspecified: Secondary | ICD-10-CM

## 2019-09-05 NOTE — Addendum Note (Signed)
Addended by: Wyvonnia Lora on: 09/05/2019 08:47 AM   Modules accepted: Orders

## 2019-09-05 NOTE — Telephone Encounter (Signed)
Noted, thank you

## 2019-09-05 NOTE — Telephone Encounter (Signed)
Medicare/bcbs Josem Kaufmann: [id number] (exp. 09/05/19 to 10/04/19) order sent to GI. They will reach out to the patient to schedule.

## 2019-09-05 NOTE — Telephone Encounter (Signed)
When you get a chance can you switch the MRI Brain order to a MRI Brain w/wo contrast. Thank you!

## 2019-09-05 NOTE — Telephone Encounter (Signed)
Order placed, thank you!

## 2019-09-09 ENCOUNTER — Other Ambulatory Visit: Payer: Self-pay

## 2019-09-09 ENCOUNTER — Ambulatory Visit
Admission: RE | Admit: 2019-09-09 | Discharge: 2019-09-09 | Disposition: A | Discharge status: Home / Self Care | Payer: BLUE CROSS/BLUE SHIELD | Source: Ambulatory Visit | Attending: Neurology | Admitting: Neurology

## 2019-09-09 DIAGNOSIS — R42 Dizziness and giddiness: Secondary | ICD-10-CM

## 2019-09-09 DIAGNOSIS — R519 Headache, unspecified: Secondary | ICD-10-CM | POA: Diagnosis not present

## 2019-09-09 DIAGNOSIS — M542 Cervicalgia: Secondary | ICD-10-CM

## 2019-09-09 DIAGNOSIS — G8929 Other chronic pain: Secondary | ICD-10-CM

## 2019-09-09 MED ORDER — GADOBENATE DIMEGLUMINE 529 MG/ML IV SOLN
15.0000 mL | Freq: Once | INTRAVENOUS | Status: AC | PRN
  Administered 2019-09-09: 15 mL via INTRAVENOUS

## 2019-09-11 ENCOUNTER — Telehealth: Payer: Self-pay | Admitting: Neurology

## 2019-09-11 NOTE — Telephone Encounter (Signed)
I called to go over the results of the MRI and left a message.  The MRI of the brain and cervical spine were unchanged compared to the previous MRIs from 2018.  The MRI of the brain shows some scattered foci consistent with chronic microvascular ischemic change.  None of the foci appear to be acute and the pattern was unchanged compared to the previous MRI.   He did have mild ethmoid chronic sinusitis but this would be unlikely to cause headache.  The MRI of the cervical spine showed some degenerative changes.  At C4-C5, there is a disc osteochondral plaques towards the right.  It could affect the right C5 nerve root.  However, if this was causing symptoms it would be expected to go into the shoulder not into the back of the head.  The top levels of the spine (that could cause pain into the back of the head), did not show significant degenerative changes.

## 2019-09-21 ENCOUNTER — Other Ambulatory Visit: Payer: Self-pay

## 2019-09-21 ENCOUNTER — Ambulatory Visit (INDEPENDENT_AMBULATORY_CARE_PROVIDER_SITE_OTHER): Payer: BC Managed Care – PPO | Admitting: Neurology

## 2019-09-21 ENCOUNTER — Encounter: Payer: Self-pay | Admitting: Neurology

## 2019-09-21 VITALS — BP 116/69 | HR 63 | Ht 74.0 in | Wt 181.5 lb

## 2019-09-21 DIAGNOSIS — R5382 Chronic fatigue, unspecified: Secondary | ICD-10-CM | POA: Diagnosis not present

## 2019-09-21 DIAGNOSIS — G4719 Other hypersomnia: Secondary | ICD-10-CM

## 2019-09-21 DIAGNOSIS — R0683 Snoring: Secondary | ICD-10-CM | POA: Insufficient documentation

## 2019-09-21 DIAGNOSIS — D8989 Other specified disorders involving the immune mechanism, not elsewhere classified: Secondary | ICD-10-CM

## 2019-09-21 DIAGNOSIS — R9089 Other abnormal findings on diagnostic imaging of central nervous system: Secondary | ICD-10-CM

## 2019-09-21 DIAGNOSIS — R519 Headache, unspecified: Secondary | ICD-10-CM | POA: Diagnosis not present

## 2019-09-21 DIAGNOSIS — G9332 Myalgic encephalomyelitis/chronic fatigue syndrome: Secondary | ICD-10-CM

## 2019-09-21 DIAGNOSIS — G8929 Other chronic pain: Secondary | ICD-10-CM
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
## 2019-09-28 ENCOUNTER — Telehealth: Payer: Self-pay | Admitting: Neurology

## 2019-09-28 NOTE — Telephone Encounter (Signed)
Received this order on Monday, 09/25/2019. This order has been pending insurance authorization. Just received information about auth this morning. Called pt, just now, and have in scheduled for tomorrow night 09/29/2019 @ 9pm.

## 2019-09-28 NOTE — Telephone Encounter (Signed)
Pt states a sleep study was mentioned by Dr Felecia Shelling on his last office visit but he has not heard from anyone re: being scheduled for a consult.  Pt is asking for the order to be placed by Dr Felecia Shelling if not done already

## 2019-09-28 NOTE — Telephone Encounter (Signed)
Noted, thank you

## 2019-09-28 NOTE — Telephone Encounter (Signed)
Reviewed pt chart. Ordered was placed 09/21/19. Pending, needs scheduling. I will route to sleep lab to contact pt to get set up.

## 2019-09-29 ENCOUNTER — Other Ambulatory Visit: Payer: Self-pay

## 2019-09-29 ENCOUNTER — Ambulatory Visit (INDEPENDENT_AMBULATORY_CARE_PROVIDER_SITE_OTHER): Payer: BC Managed Care – PPO | Admitting: Neurology

## 2019-09-29 DIAGNOSIS — R0683 Snoring: Secondary | ICD-10-CM

## 2019-09-29 DIAGNOSIS — G4733 Obstructive sleep apnea (adult) (pediatric): Secondary | ICD-10-CM | POA: Diagnosis not present

## 2019-09-29 DIAGNOSIS — G4719 Other hypersomnia: Secondary | ICD-10-CM | POA: Diagnosis not present

## 2019-10-04 DIAGNOSIS — G4733 Obstructive sleep apnea (adult) (pediatric): Secondary | ICD-10-CM | POA: Insufficient documentation

## 2019-10-04 NOTE — Progress Notes (Signed)
PATIENT'S NAME:  Eduardo Lin, Eduardo Lin DOB:      11-Dec-1950      MR#:    [id number]     DATE OF RECORDING: 09/29/2019 REFERRING M.D.:  Desiree Hane, MD Study Performed:   Baseline Polysomnogram HISTORY:  He has chronic fatigue x 15 years.    Many years ago he had a PSG and was told it was normal.   He feels sleep is nonrestorative.   He sleeps 8 hours and has nocturia x 2.   He takes naps sometimes > 2 hours most days.   He feels worse after some naps.   He snores a little bit.   He does have some daytime sleepiness and nonrestorative sleep.  Although he has a low BMI, I cannot rule out OSA/CSA and we will check a PSG to further evaluate.   The patient endorsed the Epworth Sleepiness Scale at 12 points.    The patient's weight 181 pounds with a height of 74 (inches), resulting in a BMI of 23.2 kg/m2.   CURRENT MEDICATIONS: Vit D, Mobic, Probiotic, Imitrex   PROCEDURE:  This is a multichannel digital polysomnogram utilizing the Somnostar 11.2 system.  Electrodes and sensors were applied and monitored per AASM Specifications.   EEG, EOG, Chin and Limb EMG, were sampled at 200 Hz.  ECG, Snore and Nasal Pressure, Thermal Airflow, Respiratory Effort, CPAP Flow and Pressure, Oximetry was sampled at 50 Hz. Digital video and audio were recorded.      BASELINE STUDY  Lights Out was at 21:37 and Lights On at 05:11.  Total recording time (TRT) was 455 minutes, with a total sleep time (TST) of 342.5 minutes.   The patient's sleep latency was 20 minutes.  REM latency was 100.5 minutes.  The sleep efficiency was 75.3 %.     SLEEP ARCHITECTURE: WASO (Wake after sleep onset) was 100.5 minutes.  There were 34.5 minutes in Stage N1, 210.5 minutes Stage N2, 20 minutes Stage N3 and 77.5 minutes in Stage REM.  The percentage of Stage N1 was 10.1%, Stage N2 was 61.5%, Stage N3 was 5.8% and Stage R (REM sleep) was 22.6%.  The arousals were noted as: 58 were spontaneous, 0 were associated with PLMs, 20 were  associated with respiratory events.  RESPIRATORY ANALYSIS:  There were a total of 45 respiratory events:  13 obstructive apneas, 0 central apneas and 0 mixed apneas with a total of 13 apneas and an apnea index (AI) of 2.3 /hour. There were 32 hypopneas with a hypopnea index of 5.6 /hour. The patient also had 0 respiratory event related arousals (RERAs).      The total APNEA/HYPOPNEA INDEX (AHI) was 7.9/hour and the total RESPIRATORY DISTURBANCE INDEX was  7.9 /hour.  20 events occurred in REM sleep and 38 events in NREM. The REM AHI was  15.5 /hour, versus a non-REM AHI of 5.7. The patient spent 19.5 minutes of total sleep time in the supine position and 323 minutes in non-supine.. The supine AHI was 40.0 versus a non-supine AHI of 6.0.  OXYGEN SATURATION & C02:  The Wake baseline 02 saturation was 94%, with the lowest being 86%. Time spent below 89% saturation equaled 1 minutes.  PERIODIC LIMB MOVEMENTS:   The patient had a total of 0 Periodic Limb Movements.  The Periodic Limb Movement (PLM) index was 0 and the PLM Arousal index was 0/hour.    IMPRESSION:  1. 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   OSA was more severe during  REM (REM AHI = 15.5) and also more severe during supine sleep (supine AHI = 40) 2. Relative reduced stage N3 sleep (4.5%) 3. No PLMS   RECOMMENDATIONS:  1.    As he has mild OSA and excessive daytime sleepiness, consider treatment with an oral appliance or CPAP.  BMI is normal at 23 so weight loss is unlikely to impact the mild OSA much  2.    Follow-up with Dr. Felecia Shelling   I certify that I have reviewed the entire raw data recording prior to the issuance of this report in accordance with the Standards of Accreditation of the [place] [employer] (AASM)  Amalio Loe A. Felecia Shelling, MD, PhD, FAAN Certified in Neurology, Clinical Neurophysiology, Sleep Medicine, and Neuroimaging Director, [place] [facility] [address] [place], [place] [address] [phone]    Demographics and Medical History           Name: Eduardo Lin, Eduardo Lin Age: 69 BMI: 23.2 Interp Physician: Arlice Colt, MD  DOB: 1950/09/25 Ht-IN: 74 CM: 188 Referred By: Desiree Hane, MD  Pt. Tag:  Wt-LB: 181 KG: 82 Tested By: Oneita Jolly RPSGT  Pt. #: [id number] Sex: Male Scored By: Oneita Jolly RPSGT  Bed Tag: Room 2  Race: Caucasian    Sleep Summary    Sleep Time Statistics Minutes Hours    Time in Bed 455    7.6    Total Sleep Time 342.5    5.7    Total Sleep Time NREM 265    4.4    Total Sleep Time REM 77.5    1.3    Sleep Onset 12    0.2    Wake After Sleep Onset 100.5    1.7    Wake After Sleep Period 0    0.0    Latency Persistent Sleep 20    0.3    Sleep Efficiency 75.3 Percent    Lights out 21:37     Lights on 05:11    Sleep Disruption Events Count Index    Arousals 78 13.7    Awakenings 22 3.9    Arousals + Awakenings 100 17.5    REM Awakenings 0 0.0     Sleep Stage Statistics Wake N1 N2 N3 REM    Percent Stage to SPT 22.7  7.8  47.5  4.5  17.5  Percent   Sleep Period Time in Stage 100.5 34.5 210.5 20 77.5 Minutes   Latency to Stage  12 26.5 40 100.5 Minutes   Percent Stage to TST  10.1 61.5 5.8 22.6 Percent   EKG Summary          EKG Statistics         Heart Rate, Wake 75.5 BPM  TST Epochs in HR Interval 0 < 29   Heart Rate, Steady Sleep Avg 71 BPM   0 30-59   PAC Events 0 Count   656 60-79   PVC Events 0 Count   29 80-99   Bradycardia 0 Count   0 100-119   Tachycardia 0 Count   0 120-139        0 140-159    NREM REM   0 > 160   Shortest R-R .7 .7       Longest R-R 1. .9        Respiration Summary  Event Statistics Total  With Arousal  With Awakening    Count Index  Count Index  Count Index   Apneas, Total 13 2.3  7 1.2   2 0.4    Hypopneas, Total 32 5.6  13 2.3   2 0.4    Apnea + Hypopnea Index 45 7.9   20 3.5   4 0.7    Apneas, Supine 4 12.3     Apneas,  Non Supine 9 1.7     Hypopneas, Supine 9 27.7     Hypopneas, Non Supine 23 4.3     % Sleep Apnea 1.4 Percent     % Sleep Hypopnea 3.8 Percent    Oximetry Statistics       SpO2, Mean Wake 94 Percent     SpO2, Minimum 86 Percent     SpO2, Max 96 Percent     SpO2, Mean 92 Percent            Desaturation Index, REM 11.6  Index     Desaturation Index, NREM 3.2  Index     Desaturation Index, Total 5.1  Index             SpO2 Intervals > 89% 80-89% 70-79% 60-69% 50-59% 40-49% 30-39% < 30%  342.5 Percent Sleep Time 97.1 2.9 0 0 0 0 0 0  Body Position Statistics   Back Side L Side R Side Prone    Total Sleep Time   19.5 323.0 212.5 110.5 0 Minutes   Percent Time to TST   5.7  94.3  62.0  32.3  0.0  Percent   Number of Events   13 32.0 26 6 0 Count   Number of Apneas   4 9 8 1  0 Count   Number of Hypopneas   9 23 18 5  0 Count   Apnea Index   12.3  1.7  2.3  0.5  0.0  Index   Hypopnea Index   27.7  4.3  5.1  2.7  0.0  Index   Apnea + Hypopnea Index   40.0  5.9  7.3  3.3  0.0  Index  Respiration Events    Non REM, Pre Rx Statistics Non Supine  Supine    Central Mixed Obstr  Central Mixed Obstr   Apneas 0 0 2  0 0 4 Count  Apneas, Minimum SpO2 0 0 91  0 0 89 Percent     Hypopneas 0 0 10  0 0 9 Count  Hypopneas, Minimum SpO2 0 0 90  0 0 86 Percent     Apnea + Hypopneas Index 0.0 0.0 2.9  0.0 0.0 40.0 Index    REM, Pre Rx Statistics Non Supine  Supine    Central Mixed Obstr  Central Mixed Obstr   Apneas 0 0 7  0 0 0 Count  Apneas, Minimum SpO2 0 0 90  0 0 0 Percent     Hypopneas 0 0 13  0 0 0 Count  Hypopneas, Minimum SpO2 0 0 89  0 0 0 Percent     Apnea + Hypopnea Index 0.0 0.0 15.5  0.0 0.0 0.0 Index  Leg Movement Summary    PLM Non REM (Incl. Wake) REM Total    No Arousal Arousal Wake No Arousal Arousal Wake No Arousal Arousal Wake Total   Isolated 0 0 0 0 0 0 0 0 0 0    PLMS 0 0 0 0 0 0 0 0 0 0    Total 0 0 0 0 0 0 0 0 0 0   PLM Statistics PLMS Total  Count Index Count Index    PLM 0 0 0 0.0     PLM with Arousal 0 0 0 0.0     PLM, with Wake 0 0 0 0.0     PLM, Arousal + Wake 0 0.0 0 0.0     PLM, No Arousal 0 0.0  0 0.0     PLM, Non REM 0 0.0  0 0.0     PLM, REM 0 0.0  0 0.0     Technician Comments:  The Patient was scored using the 4% rule.  His AHI would have been higher had I been able to use the 3% rule.    He had two trips to the restroom.  I heard mild snoring while he was supine.  His EKG showed NSR throughout the study.

## 2019-10-05 ENCOUNTER — Encounter: Payer: Self-pay | Admitting: *Deleted

## 2020-01-15 IMAGING — MR MR HEAD W/O CM
10 series · 48 of 48 positions shown · non-contrast
Comparison: none

[Series 2: t1_se_sag · sagittal · 5.0mm · 0.45mm/px · 2 of 21 slices shown]
[im 1/21]
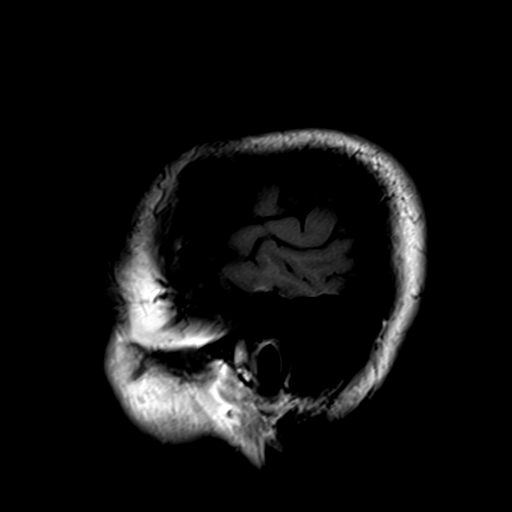
[im 21/21]
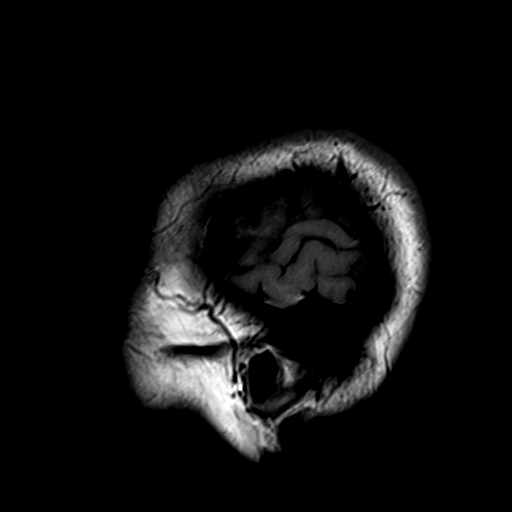

[Series 3: t2_tse_tra_512 · axial · 5.0mm · 0.60mm/px · z∈[-70,+80]mm · 2 of 26 slices shown]
[im 1/26]
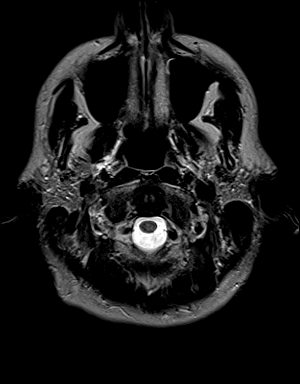
[im 26/26]
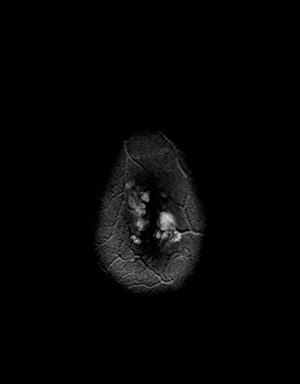

[Series 4: ep2d_diff_(id)_trace · axial · 3.0mm · 1.80mm/px · z∈[-70,+77]mm · 9 of 100 slices shown]
[im 1/100]
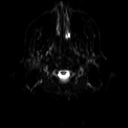
[im 13/100]
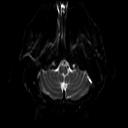
[im 25/100]
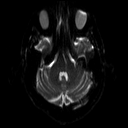
[im 38/100]
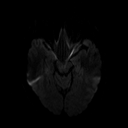
[im 50/100]
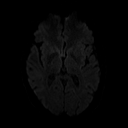
[im 62/100]
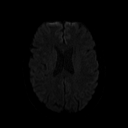
[im 75/100]
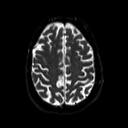
[im 87/100]
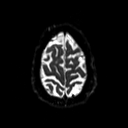
[im 100/100]
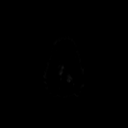

[Series 5: ep2d_diff_(id)_trace_adc · axial · 3.0mm · 1.80mm/px · z∈[-70,+77]mm · 5 of 50 slices shown]
[im 1/50]
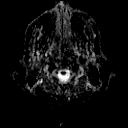
[im 13/50]
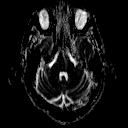
[im 25/50]
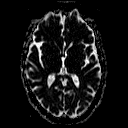
[im 37/50]
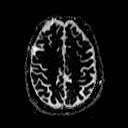
[im 50/50]
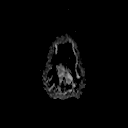

[Series 6: ep2d_diff_cor · coronal · 5.0mm · 1.77mm/px · 5 of 53 slices shown]
[im 1/53]
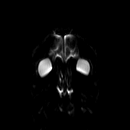
[im 14/53]
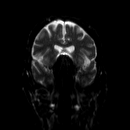
[im 27/53]
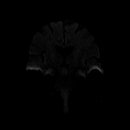
[im 40/53]
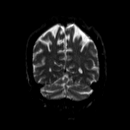
[im 53/53]
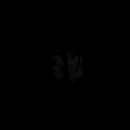

[Series 7: ep2d_diff_cor_adc · coronal · 5.0mm · 1.77mm/px · 2 of 27 slices shown]
[im 1/27]
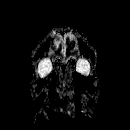
[im 27/27]
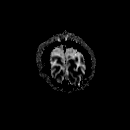

[Series 9: swi_images · axial · 4.0mm · 0.90mm/px · z∈[-72,+83]mm · 4 of 40 slices shown]
[im 1/40]
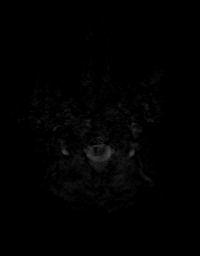
[im 14/40]
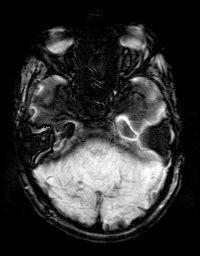
[im 27/40]
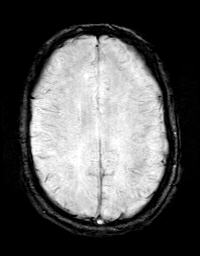
[im 40/40]
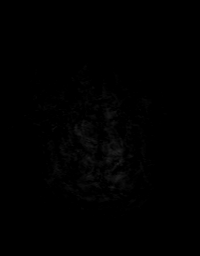

[Series 11: FLAIR · axial · 3.0mm · 0.43mm/px · z∈[-68,+75]mm · 3 of 30 slices shown]
[im 1/30]
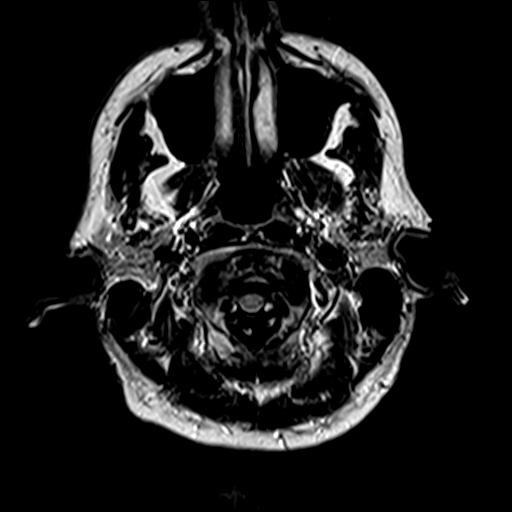
[im 15/30]
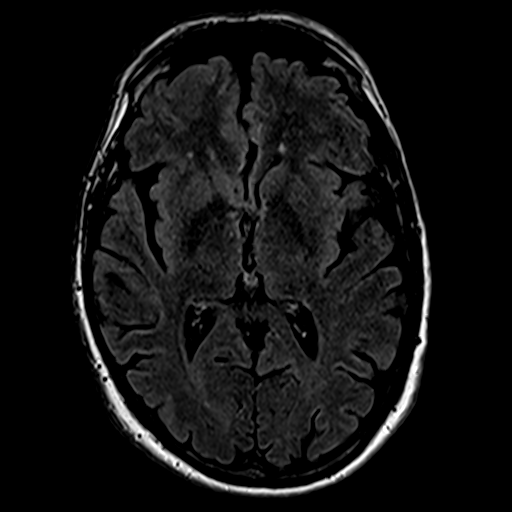
[im 30/30]
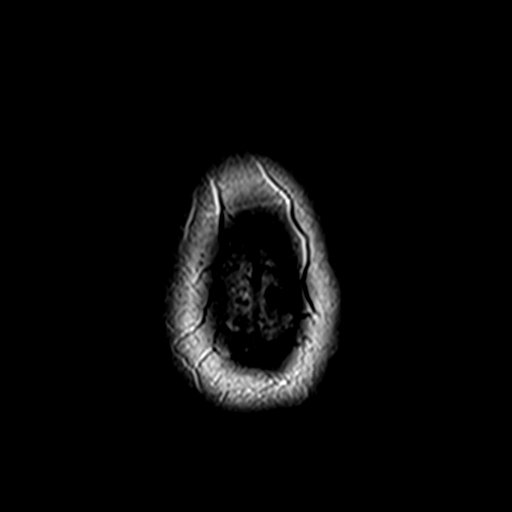

[Series 12: t1_mpr_tra · axial · 1.1mm · 0.72mm/px · z∈[-73,+83]mm · 13 of 144 slices shown]
[im 1/144]
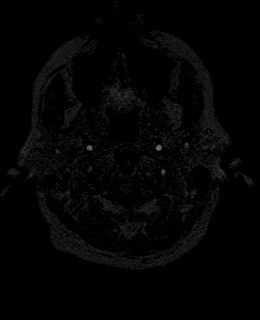
[im 12/144]
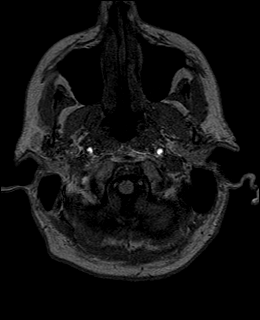
[im 24/144]
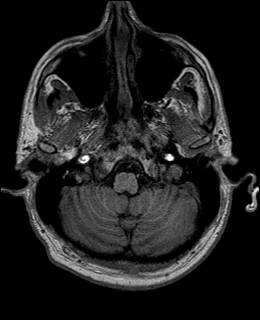
[im 36/144]
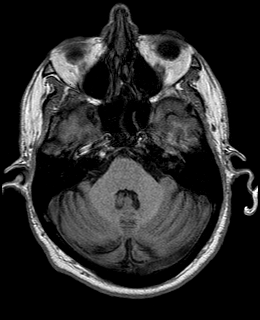
[im 48/144]
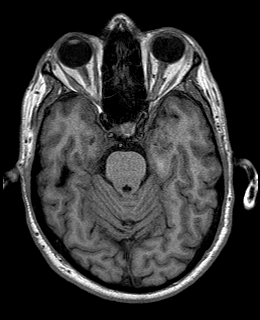
[im 60/144]
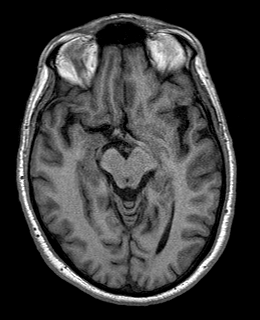
[im 72/144]
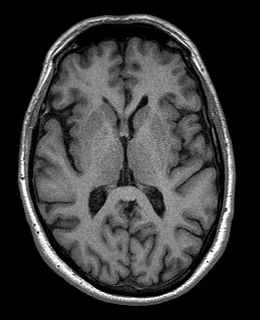
[im 84/144]
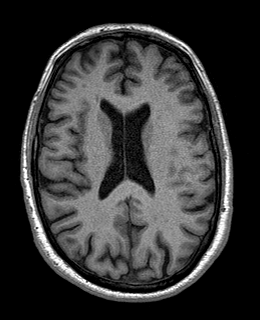
[im 96/144]
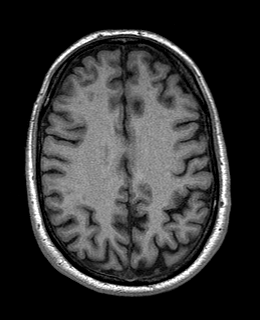
[im 108/144]
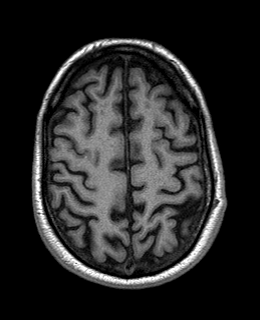
[im 120/144]
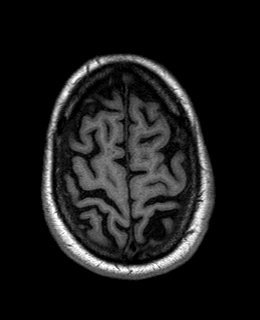
[im 132/144]
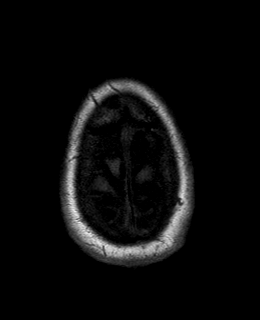
[im 144/144]
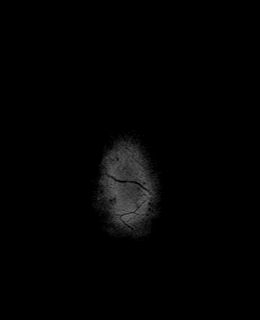

[Series 13: T2 · coronal · 5.0mm · 0.45mm/px · 3 of 28 slices shown]
[im 1/28]
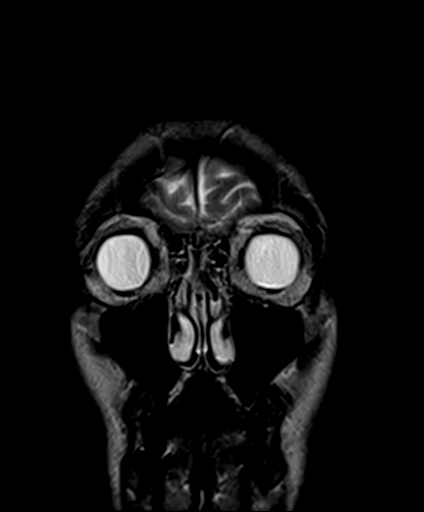
[im 14/28]
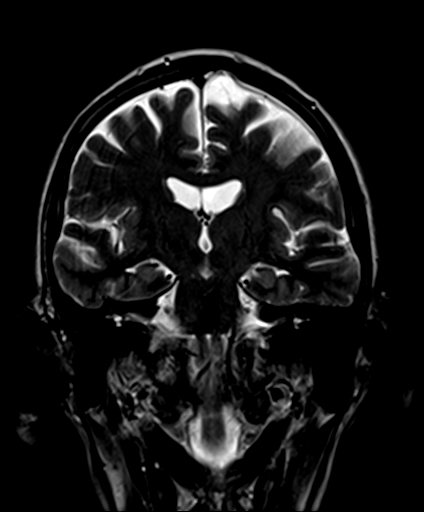
[im 28/28]
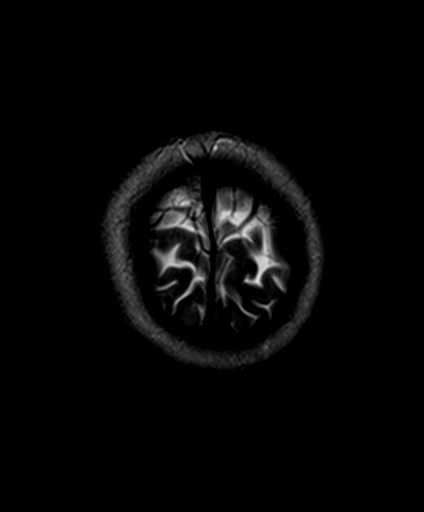

[48 of 48 positions shown; findings below may reference images not displayed]

Canned report from images found in remote index.

Refer to host system for actual result text.

## 2020-02-29 DIAGNOSIS — H2513 Age-related nuclear cataract, bilateral: Secondary | ICD-10-CM | POA: Insufficient documentation

## 2020-03-25 ENCOUNTER — Other Ambulatory Visit: Payer: Self-pay

## 2020-03-25 ENCOUNTER — Encounter: Payer: Self-pay | Admitting: Neurology

## 2020-03-25 ENCOUNTER — Ambulatory Visit (INDEPENDENT_AMBULATORY_CARE_PROVIDER_SITE_OTHER): Payer: BC Managed Care – PPO | Admitting: Neurology

## 2020-03-25 VITALS — BP 120/74 | HR 67 | Ht 74.0 in | Wt 182.5 lb

## 2020-03-25 DIAGNOSIS — R519 Headache, unspecified: Secondary | ICD-10-CM | POA: Diagnosis not present

## 2020-03-25 DIAGNOSIS — R5382 Chronic fatigue, unspecified: Secondary | ICD-10-CM

## 2020-03-25 DIAGNOSIS — G43009 Migraine without aura, not intractable, without status migrainosus: Secondary | ICD-10-CM

## 2020-03-25 DIAGNOSIS — D8989 Other specified disorders involving the immune mechanism, not elsewhere classified: Secondary | ICD-10-CM

## 2020-03-25 DIAGNOSIS — R42 Dizziness and giddiness: Secondary | ICD-10-CM | POA: Insufficient documentation

## 2020-03-25 DIAGNOSIS — M791 Myalgia, unspecified site: Secondary | ICD-10-CM | POA: Diagnosis not present

## 2020-03-25 DIAGNOSIS — E559 Vitamin D deficiency, unspecified: Secondary | ICD-10-CM

## 2020-03-25 DIAGNOSIS — G8929 Other chronic pain: Secondary | ICD-10-CM

## 2020-03-25 MED ORDER — TIZANIDINE HCL 2 MG PO CAPS: 2.0000 mg | ORAL_CAPSULE | Freq: Three times a day (TID) | ORAL | 5 refills | Status: DC | PRN

## 2020-03-25 MED ORDER — SUMATRIPTAN SUCCINATE 100 MG PO TABS
ORAL_TABLET | ORAL | 3 refills | Status: DC
Start: 2020-03-25 — End: 2020-09-24

## 2020-03-25 NOTE — Progress Notes (Signed)
[facility]  PATIENT: Eduardo Lin DOB: 19-Apr-1950  REFERRING DOCTOR OR PCP:  Nance Pew  SOURCE: patient and records form [facility]  _________________________________   HISTORICAL  CHIEF COMPLAINT:  Chief Complaint  Patient presents with  . Headache    Rm 13, FU CFIDS, "discuss supplements, general review"    HISTORY OF PRESENT ILLNESS:  Eduardo Lin is a 70 y.o.man with persistent migraine Headaches and Chronic Fatigue Syndrome.   Update 03/25/2020: He fell hitting his head and neck on a hard surface and then a fall.   Qe did imaging studies no acute findings or changes from previous scans.   He was continuing to have  He saw ENT a [place] and was diagnosed with Eustacian tube dysfunction.   A tube was placed.    Headaches and stuffiness are better.   He still has dizziness.    Audiogram showed symmetric HF SNHL bilaterally.   Vestibular evaluation showed no evidence of peripheral or central vestibular.    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   For the migraines, he recommended Magnesium and B2.       He was on Mg already at a lower dose.   He started taking 500 mg po bid Mg and 2000 mcg B12.    Since starting B12, he feels less fatigued.  (B12 was normal in 11/30/17   Muscle aches also improved.   He also takes Vit D2 4000    MRI cervical spine 09/09/2019 showed IMPRESSION: This MRI of the cervical spine without contrast shows the following: 1.   The spinal cord appears normal. 2.   At C4-C5, there is a right disc osteophyte complex causing moderate to moderately severe right foraminal narrowing that has some potential for right C5 nerve root compression.  This looks unchanged compared to the 2018 MRI. 3.   At C5-C6, there are degenerative changes causing mild to moderate right foraminal narrowing but no nerve root compression.  MRI Brain 09/11/2019 showed:   IMPRESSION: This MRI of the brain with and without contrast shows the  following: 1.   There are some scattered T2/FLAIR hyperintense foci in the subcortical and deep white matter.  This is most consistent with chronic microvascular ischemic change and is unchanged compared to the 10/05/2017 MRI.  Foci like these could also be seen as the sequela of migraine headache, trauma or previous inflammation/infection. 2.   Minimal chronic ethmoid sinusitis.   3.   There were no acute findings and there was a normal enhancement pattern.  Polysomnography 10/17/2019 1. 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   OSA was more severe during REM (REM AHI = 15.5) and also more severe during supine sleep (supine AHI = 40) 2. Relative reduced stage N3 sleep (4.5%) 3. No PLMS   REVIEW OF SYSTEMS: Constitutional: No fevers, chills, sweats, or change in appetite.   He has fatigue.   Sleeps 8 hours/night Eyes: No visual changes, double vision, eye pain Ear, nose and throat: No hearing loss, ear pain, nasal congestion, sore throat Cardiovascular: No chest pain, palpitations Respiratory: No shortness of breath at rest or with exertion.   No wheezes. No snoring. GastrointestinaI: No nausea, vomiting, diarrhea, abdominal pain, fecal incontinence Genitourinary: No dysuria, urinary retention or frequency.  Once nightly nocturia. Musculoskeletal: No neck pain, back pain Integumentary: No rash, pruritus, skin lesions Neurological: as above Psychiatric: No depression at this time.  No anxiety Endocrine: No palpitations, diaphoresis, change in appetite, change in weigh  or increased thirst Hematologic/Lymphatic: No anemia, purpura, petechiae. Allergic/Immunologic: No itchy/runny eyes, nasal congestion, recent allergic reactions, rashes  ALLERGIES: Allergies  Allergen Reactions  . Nickel     Other reaction(s): Other (See Comments) Other Reaction: rash on hands  . Hydrocodone-Acetaminophen     Other reaction(s): Other (See Comments) Other Reaction: hallucinate    HOME  MEDICATIONS:  Current Outpatient Medications:  .  Cholecalciferol (VITAMIN D3 PO), Take 4,000 Units by mouth daily., Disp: , Rfl:  .  Cyanocobalamin (B-12) 100 MCG TABS, Take 200 mcg by mouth daily., Disp: , Rfl:  .  Magnesium 500 MG CAPS, Take by mouth., Disp: , Rfl:  .  tizanidine (ZANAFLEX) 2 MG capsule, Take 1 capsule (2 mg total) by mouth 3 (three) times daily as needed for muscle spasms., Disp: 90 capsule, Rfl: 5 .  meloxicam (MOBIC) 7.5 MG tablet, Take 1 tablet (7.5 mg total) by mouth daily. (Patient not taking: Reported on 03/25/2020), Disp: 30 tablet, Rfl: 5 .  SUMAtriptan (IMITREX) 100 MG tablet, 1 pill as needed when necessary headache. May repeat in 2 hours if necessary., Disp: 30 tablet, Rfl: 3  PAST MEDICAL HISTORY: Past Medical History:  Diagnosis Date  . Cancer ([place])   . Headache   . Prostate cancer ([place])   . Vision abnormalities     PAST SURGICAL HISTORY: Past Surgical History:  Procedure Laterality Date  . CHOLECYSTECTOMY, LAPAROSCOPIC    . PROSTATECTOMY    . WISDOM TOOTH EXTRACTION      FAMILY HISTORY: No family history on file.  SOCIAL HISTORY:  Social History   Socioeconomic History  . Marital status: Married    Spouse name: Not on file  . Number of children: Not on file  . Years of education: Not on file  . Highest education level: Not on file  Occupational History  . Not on file  Tobacco Use  . Smoking status: Never Smoker  . Smokeless tobacco: Never Used  Substance and Sexual Activity  . Alcohol use: No    Alcohol/week: 0.0 standard drinks  . Drug use: No  . Sexual activity: Not on file  Other Topics Concern  . Not on file  Social History Narrative  . Not on file   Social Determinants of Health   Financial Resource Strain: Not on file  Food Insecurity: Not on file  Transportation Needs: Not on file  Physical Activity: Not on file  Stress: Not on file  Social Connections: Not on file  Intimate Partner Violence: Not on file      PHYSICAL EXAM  Vitals:   03/25/20 1107  BP: 120/74  Pulse: 67  Weight: 182 lb 8 oz (82.8 kg)  Height: 6\' 2"  (1.88 m)    Body mass index is 23.43 kg/m.   General: The patient is well-developed and well-nourished and in no acute distress.   The heart has a regular rate and rhythm with a normal S1 and S2 and there are no murmurs.   Musculoskeletal: The neck has a normal range of motion.  There is some tenderness in the paraspinal muscles.  He does not have typical upper chest/upper back fibromyalgia tender point pain.  Neurologic Exam  Mental status: The patient is alert and oriented x 3 at the time of the examination. The patient has apparent normal recent and remote memory, with an apparently normal attention span and concentration ability.   Speech is normal.  Cranial nerves: Extraocular movements are full.  Facial strength and sensation was normal.  Trapezius strength is normal.. No obvious hearing deficits are noted.  Motor:  Muscle bulk is normal.   Tone is normal.  Strength is 5/5.  He does not need to use his hands to get out of the chair.  Sensory: Sensory testing is intact to touch and vibration sensation  Coordination: Cerebellar testing reveals good finger-nose-finger bilaterally.  Gait and station: Station is normal.  Gait is normal for age.  Tandem gait is mildly wide..  Romberg is negative.  Reflexes: Deep tendon reflexes are symmetric and normal bilaterally.  Marland Kitchen    DIAGNOSTIC DATA (LABS, IMAGING, TESTING) - I reviewed patient records, labs, notes, testing and imaging myself where available.     ASSESSMENT AND PLAN  Chronic intractable headache, unspecified headache type  CFIDS (chronic fatigue and immune dysfunction syndrome) ([place]) - Plan: Comprehensive metabolic panel, Magnesium, Vitamin B12  Myalgia - Plan: Comprehensive metabolic panel  Vertigo - Plan: Vitamin B12  Migraine without aura and without status migrainosus, not  intractable  Vitamin D deficiency - Plan: VITAMIN D 25 Hydroxy (Vit-D Deficiency, Fractures)   1.   he has done better since increasing vitamin supplements.  He could try to go up further on the magnesium to 250 mg 3 times a day and add riboflavin (B2) 100 mg twice a day.  Continue vitamin D and vitamin B12 I will check some blood work. 2. stay active and exercise as tolerated.. 3.   Return in 6 months, sooner if new or worsening neurologic symptoms.   Cosette Prindle A. Felecia Shelling, MD, PhD 0000000, 0000000 PM Certified in Neurology, Clinical Neurophysiology, Sleep Medicine, Pain Medicine and Neuroimaging  [facility] [address] [place], [place] [address] [phone]

## 2020-03-26 LAB — COMPREHENSIVE METABOLIC PANEL
ALT: 17 IU/L (ref 0–44)
AST: 21 IU/L (ref 0–40)
Albumin/Globulin Ratio: 1.6 (ref 1.2–2.2)
Albumin: 4.2 g/dL (ref 3.8–4.8)
Alkaline Phosphatase: 69 IU/L (ref 44–121)
BUN/Creatinine Ratio: 12 (ref 10–24)
BUN: 12 mg/dL (ref 8–27)
Bilirubin Total: 1 mg/dL (ref 0.0–1.2)
CO2: 26 mmol/L (ref 20–29)
Calcium: 9.4 mg/dL (ref 8.6–10.2)
Chloride: 102 mmol/L (ref 96–106)
Creatinine, Ser: 0.99 mg/dL (ref 0.76–1.27)
GFR calc Af Amer: 91 mL/min/{1.73_m2} (ref >59)
GFR calc non Af Amer: 78 mL/min/{1.73_m2} (ref >59)
Globulin, Total: 2.6 g/dL (ref 1.5–4.5)
Glucose: 95 mg/dL (ref 65–99)
Potassium: 5 mmol/L (ref 3.5–5.2)
Sodium: 139 mmol/L (ref 134–144)
Total Protein: 6.8 g/dL (ref 6.0–8.5)

## 2020-03-26 LAB — VITAMIN D 25 HYDROXY (VIT D DEFICIENCY, FRACTURES): Vit D, 25-Hydroxy: 66.2 ng/mL (ref 30.0–100.0)

## 2020-03-26 LAB — VITAMIN B12: Vitamin B-12: 1338 pg/mL — ABNORMAL HIGH (ref 232–1245)

## 2020-03-26 LAB — MAGNESIUM: Magnesium: 2.3 mg/dL (ref 1.6–2.3)

## 2020-09-24 ENCOUNTER — Ambulatory Visit (INDEPENDENT_AMBULATORY_CARE_PROVIDER_SITE_OTHER): Payer: Medicare Other | Admitting: Neurology

## 2020-09-24 ENCOUNTER — Encounter: Payer: Self-pay | Admitting: Neurology

## 2020-09-24 VITALS — BP 119/73 | HR 61 | Ht 74.0 in | Wt 178.5 lb

## 2020-09-24 DIAGNOSIS — R5382 Chronic fatigue, unspecified: Secondary | ICD-10-CM | POA: Diagnosis not present

## 2020-09-24 DIAGNOSIS — G4719 Other hypersomnia: Secondary | ICD-10-CM

## 2020-09-24 DIAGNOSIS — G8929 Other chronic pain: Secondary | ICD-10-CM

## 2020-09-24 DIAGNOSIS — G43009 Migraine without aura, not intractable, without status migrainosus: Secondary | ICD-10-CM | POA: Diagnosis not present

## 2020-09-24 DIAGNOSIS — R519 Headache, unspecified: Secondary | ICD-10-CM

## 2020-09-24 DIAGNOSIS — G9332 Myalgic encephalomyelitis/chronic fatigue syndrome: Secondary | ICD-10-CM

## 2020-09-24 DIAGNOSIS — D8989 Other specified disorders involving the immune mechanism, not elsewhere classified: Secondary | ICD-10-CM

## 2020-09-24 DIAGNOSIS — M791 Myalgia, unspecified site: Secondary | ICD-10-CM

## 2020-09-24 MED ORDER — ETODOLAC 400 MG PO TABS: 400.0000 mg | ORAL_TABLET | Freq: Every evening | ORAL | 5 refills | Status: AC | PRN

## 2020-09-24 MED ORDER — SUMATRIPTAN SUCCINATE 100 MG PO TABS: ORAL_TABLET | ORAL | 3 refills | Status: DC

## 2020-09-24 MED ORDER — OXYBUTYNIN CHLORIDE 5 MG PO TABS: 5.0000 mg | ORAL_TABLET | Freq: Two times a day (BID) | ORAL | 11 refills | Status: DC | PRN

## 2020-09-24 NOTE — Progress Notes (Signed)
[facility]  PATIENT: Eduardo Lin DOB: Sep 23, 1950  REFERRING DOCTOR OR PCP:  Nance Pew  SOURCE: patient and records form [facility]  _________________________________   HISTORICAL  CHIEF COMPLAINT:  Chief Complaint  Patient presents with   Follow-up    RM 1 w/ wife. Last seen 03/25/20. HA- magnesium/riboflavin    HISTORY OF PRESENT ILLNESS:  Eduardo Lin is a 70 y.o.man with persistent migraine Headaches and Chronic Fatigue Syndrome.   Update 09/24/2020: He reports headaches are doing better since his last visit.       He goes to bed around 830 pm.  When he wakes to use the bathroom,  he feels unstable.   He falls asleep but reports when he wakes up in the morning, he has pain in the occiput.   He notes a grinding sound in the neck.   If he moves his neck, however, headache may improve.  He fell last July hitting his head and neck on a hard surface and then a fall.   Imaging studies showed no acute findings or changes from previous scans.   Headaches and stuffiness improved after a Eustacian tube   He still has dizziness.    Audiogram showed symmetric HF SNHL bilaterally.   Vestibular evaluation showed no evidence of peripheral or central vestibular.    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     For the migraines, he takes Magnesium and B2.  He was on Mg already at a lower dose.   He started taking 500 mg po bid Mg and 2000 mcg B12.    Since starting B12, he feels less fatigued.  (B12 was normal in 11/30/17   Muscle aches also improved.   He also takes Vit D2 4000   He continues to have more fatigue and feels it is actually worse.   PSG showed mild OSa but mostly while on back (and wife notes he spends almost the whole night on his side.)   MRI cervical spine 09/09/2019 showed IMPRESSION: This MRI of the cervical spine without contrast shows the following: 1.   The spinal cord appears normal. 2.   At C4-C5, there is a right  disc osteophyte complex causing moderate to moderately severe right foraminal narrowing that has some potential for right C5 nerve root compression.  This looks unchanged compared to the 2018 MRI. 3.   At C5-C6, there are degenerative changes causing mild to moderate right foraminal narrowing but no nerve root compression.  MRI Brain 09/11/2019 showed:   IMPRESSION: This MRI of the brain with and without contrast shows the following: 1.   There are some scattered T2/FLAIR hyperintense foci in the subcortical and deep white matter.  This is most consistent with chronic microvascular ischemic change and is unchanged compared to the 10/05/2017 MRI.  Foci like these could also be seen as the sequela of migraine headache, trauma or previous inflammation/infection. 2.   Minimal chronic ethmoid sinusitis.   3.   There were no acute findings and there was a normal enhancement pattern.  Polysomnography 10/17/2019 Mild overall OSA with an AHI=7.9.   OSA was more severe during REM (REM AHI = 15.5) and also more severe during supine sleep (supine AHI = 40) Relative reduced stage N3 sleep (4.5%) No PLMS   REVIEW OF SYSTEMS: Constitutional: No fevers, chills, sweats, or change in appetite.   He has fatigue.   Sleeps 8 hours/night Eyes: No visual changes, double vision, eye pain Ear, nose and  throat: No hearing loss, ear pain, nasal congestion, sore throat Cardiovascular: No chest pain, palpitations Respiratory:  No shortness of breath at rest or with exertion.   No wheezes. No snoring. GastrointestinaI: No nausea, vomiting, diarrhea, abdominal pain, fecal incontinence Genitourinary:  No dysuria, urinary retention or frequency.  Once nightly nocturia. Musculoskeletal:  No neck pain, back pain Integumentary: No rash, pruritus, skin lesions Neurological: as above Psychiatric: No depression at this time.  No anxiety Endocrine: No palpitations, diaphoresis, change in appetite, change in weigh or increased  thirst Hematologic/Lymphatic:  No anemia, purpura, petechiae. Allergic/Immunologic: No itchy/runny eyes, nasal congestion, recent allergic reactions, rashes  ALLERGIES: Allergies  Allergen Reactions   Nickel     Other reaction(s): Other (See Comments) Other Reaction: rash on hands   Hydrocodone-Acetaminophen     Other reaction(s): Other (See Comments) Other Reaction: hallucinate    HOME MEDICATIONS:  Current Outpatient Medications:    Cholecalciferol (VITAMIN D3 PO), Take 4,000 Units by mouth daily., Disp: , Rfl:    Cyanocobalamin (B-12) 100 MCG TABS, Take 200 mcg by mouth daily., Disp: , Rfl:    Magnesium 500 MG CAPS, Take 250 mg by mouth daily., Disp: , Rfl:    omeprazole (PRILOSEC) 40 MG capsule, Take 40 mg by mouth daily., Disp: , Rfl:    Potassium (POTASSIMIN PO), Take 1 tablet by mouth daily., Disp: , Rfl:    Probiotic Product (PROBIOTIC PO), Take by mouth., Disp: , Rfl:    Riboflavin (B-2 PO), Take 1,000 mg by mouth daily., Disp: , Rfl:    SUMAtriptan (IMITREX) 100 MG tablet, 1 pill as needed when necessary headache. May repeat in 2 hours if necessary., Disp: 30 tablet, Rfl: 3   tizanidine (ZANAFLEX) 2 MG capsule, Take 1 capsule (2 mg total) by mouth 3 (three) times daily as needed for muscle spasms. (Patient not taking: Reported on 09/24/2020), Disp: 90 capsule, Rfl: 5  PAST MEDICAL HISTORY: Past Medical History:  Diagnosis Date   Cancer ([place])    Headache    Prostate cancer ([place])    Vision abnormalities     PAST SURGICAL HISTORY: Past Surgical History:  Procedure Laterality Date   CHOLECYSTECTOMY, LAPAROSCOPIC     PROSTATECTOMY     WISDOM TOOTH EXTRACTION      FAMILY HISTORY: No family history on file.  SOCIAL HISTORY:  Social History   Socioeconomic History   Marital status: Married    Spouse name: Not on file   Number of children: Not on file   Years of education: Not on file   Highest education level: Not on file  Occupational History   Not on file   Tobacco Use   Smoking status: Never   Smokeless tobacco: Never  Substance and Sexual Activity   Alcohol use: No    Alcohol/week: 0.0 standard drinks   Drug use: No   Sexual activity: Not on file  Other Topics Concern   Not on file  Social History Narrative   Not on file   Social Determinants of Health   Financial Resource Strain: Not on file  Food Insecurity: Not on file  Transportation Needs: Not on file  Physical Activity: Not on file  Stress: Not on file  Social Connections: Not on file  Intimate Partner Violence: Not on file     PHYSICAL EXAM  Vitals:   09/24/20 1557  BP: 120/80  Weight: 178 lb 8 oz (81 kg)  Height: 6\' 2"  (1.88 m)    Body mass index is 22.92  kg/m.   General: The patient is well-developed and well-nourished and in no acute distress.   The heart has a regular rate and rhythm with a normal S1 and S2 and there are no murmurs.   Musculoskeletal: The neck has a normal range of motion.  There is some tenderness in the mid occiput.  No lower paraspinal tenderness   He does not have typical upper chest/upper back fibromyalgia tender point pain.  Neurologic Exam  Mental status: The patient is alert and oriented x 3 at the time of the examination. The patient has apparent normal recent and remote memory, with an apparently normal attention span and concentration ability.   Speech is normal.  Cranial nerves: Extraocular movements are full.  Facial strength and sensation was normal.  Trapezius strength is normal.. No obvious hearing deficits are noted.  Motor:  Muscle bulk is normal.   Tone is normal.  Strength is 5/5.  He does not need to use his hands to get out of the chair.  Sensory: Sensory testing is intact to touch and vibration sensation  Coordination: Cerebellar testing reveals good finger-nose-finger bilaterally.  Gait and station: Station is normal.  Gait is normal for age.  Tandem gait is mildly wide..  Romberg is negative.  Reflexes: Deep  tendon reflexes are symmetric and normal bilaterally.  .     ASSESSMENT AND PLAN  Chronic intractable headache, unspecified headache type  CFIDS (chronic fatigue and immune dysfunction syndrome) ([facility])  Migraine without aura and without status migrainosus, not intractable  Excessive daytime sleepiness  Myalgia   1.   Continue vitamin supplements.   2.   stay active and exercise as tolerated.. 3.   Trial of oxybutynin for nocturia.   Stop if not tolerated or no improvement after a couple weeks. 4.   Trial of nocturnal etodolac for neck pain.   5.   Return in 6 months, sooner if new or worsening neurologic symptoms.   Runell Kovich A. Felecia Shelling, MD, PhD 1/57/2620, 3:55 PM Certified in Neurology, Clinical Neurophysiology, Sleep Medicine, Pain Medicine and Neuroimaging  [facility] [address], [place], [place] [address] [phone]

## 2020-12-13 DIAGNOSIS — K635 Polyp of colon: Secondary | ICD-10-CM | POA: Insufficient documentation

## 2021-03-26 ENCOUNTER — Encounter: Payer: Self-pay | Admitting: Neurology

## 2021-03-26 ENCOUNTER — Other Ambulatory Visit: Payer: Self-pay

## 2021-03-26 ENCOUNTER — Ambulatory Visit (INDEPENDENT_AMBULATORY_CARE_PROVIDER_SITE_OTHER): Payer: Medicare Other | Admitting: Neurology

## 2021-03-26 VITALS — BP 116/72 | HR 79 | Ht 74.0 in | Wt 165.5 lb

## 2021-03-26 DIAGNOSIS — G43019 Migraine without aura, intractable, without status migrainosus: Secondary | ICD-10-CM | POA: Diagnosis not present

## 2021-03-26 DIAGNOSIS — M542 Cervicalgia: Secondary | ICD-10-CM

## 2021-03-26 DIAGNOSIS — D8989 Other specified disorders involving the immune mechanism, not elsewhere classified: Secondary | ICD-10-CM | POA: Diagnosis not present

## 2021-03-26 DIAGNOSIS — G4733 Obstructive sleep apnea (adult) (pediatric): Secondary | ICD-10-CM

## 2021-03-26 DIAGNOSIS — R42 Dizziness and giddiness: Secondary | ICD-10-CM | POA: Diagnosis not present

## 2021-03-26 DIAGNOSIS — G9332 Myalgic encephalomyelitis/chronic fatigue syndrome: Secondary | ICD-10-CM

## 2021-03-26 NOTE — Progress Notes (Signed)
[facility]  PATIENT: Eduardo Lin DOB: [birth year]  REFERRING DOCTOR OR PCP:  Dr. Glenda Chroman [facility]) SOURCE: patient and records form [facility]  _________________________________   HISTORICAL  CHIEF COMPLAINT:  Chief Complaint  Patient presents with   Follow-up    Rm 2, alone. Here for 6 month migraine f/u. Migraines are less frequent. Muscle weakness and ache has increased. Pt reports being more mentally foggy. Low energy and excessively tired.     HISTORY OF PRESENT ILLNESS:  Eduardo Lin is a 71 y.o.man with persistent migraine headaches and Chronic Fatigue Syndrome.   Update 03/28/2021: Migraines have doe better the last year but he is noting more fatigue and myalgias.  Additionally, he has lost some weight.   He feels mentally more dulled.     If he has any function to go to, he takes Tylenol for the pain and caffeine for the fatigue with mild benefit.   He has lost 13 pounds in last 6 months.     He is sleeping a little better.  He gets up once a night to use the bathroom.   Oxybutynin has helped the bladder urgency and he takes occasionally.       He continues to have neck pain and has a grinding sensation in his neck at times.   He will take etodolac, Advil or Tylenol if pain acts up more.    He reports some dizziness.    Audiogram showed symmetric HF SNHL bilaterally.   Vestibular evaluation showed no evidence of peripheral or central vestibular.    He had tubes placed and feels dizziness improved but tinnitus was unchanged.     For the migraines, he takes Magnesium.      He started taking 500 mg po bid Mg and 2000 mcg B12.    Since starting B12, he feels less fatigued.  (B12 was normal in 11/30/17      He also takes Vit D2 4000   He continues to have more fatigue and feels it is actually worse.   PSG showed mild OSa but mostly while on back (and wife notes he spends almost the whole night on his side.)  He has OSA (mild overall,  moderate during REM, worse supine (sleeps on side though).       MRI cervical spine 09/09/2019 showed IMPRESSION: This MRI of the cervical spine without contrast shows the following: 1.   The spinal cord appears normal. 2.   At C4-C5, there is a right disc osteophyte complex causing moderate to moderately severe right foraminal narrowing that has some potential for right C5 nerve root compression.  This looks unchanged compared to the 2018 MRI. 3.   At C5-C6, there are degenerative changes causing mild to moderate right foraminal narrowing but no nerve root compression.  MRI Brain 09/11/2019 showed:   IMPRESSION: This MRI of the brain with and without contrast shows the following: 1.   There are some scattered T2/FLAIR hyperintense foci in the subcortical and deep white matter.  This is most consistent with chronic microvascular ischemic change and is unchanged compared to the 10/05/2017 MRI.  Foci like these could also be seen as the sequela of migraine headache, trauma or previous inflammation/infection. 2.   Minimal chronic ethmoid sinusitis.   3.   There were no acute findings and there was a normal enhancement pattern.  Polysomnography 10/17/2019 Mild overall OSA with an AHI=7.9.   OSA was more severe during REM (REM AHI = 15.5) and  also more severe during supine sleep (supine AHI = 40) Relative reduced stage N3 sleep (4.5%) No PLMS   REVIEW OF SYSTEMS: Constitutional: No fevers, chills, sweats, or change in appetite.   He has fatigue.   Sleeps 8 hours/night Eyes: No visual changes, double vision, eye pain Ear, nose and throat: No hearing loss, ear pain, nasal congestion, sore throat Cardiovascular: No chest pain, palpitations Respiratory:  No shortness of breath at rest or with exertion.   No wheezes. No snoring. GastrointestinaI: No nausea, vomiting, diarrhea, abdominal pain, fecal incontinence Genitourinary:  No dysuria, urinary retention or frequency.  Once nightly  nocturia. Musculoskeletal:  No neck pain, back pain Integumentary: No rash, pruritus, skin lesions Neurological: as above Psychiatric: No depression at this time.  No anxiety Endocrine: No palpitations, diaphoresis, change in appetite, change in weigh or increased thirst Hematologic/Lymphatic:  No anemia, purpura, petechiae. Allergic/Immunologic: No itchy/runny eyes, nasal congestion, recent allergic reactions, rashes  ALLERGIES: Allergies  Allergen Reactions   Nickel     Other reaction(s): Other (See Comments) Other Reaction: rash on hands   Hydrocodone-Acetaminophen     Other reaction(s): Other (See Comments) Other Reaction: hallucinate    HOME MEDICATIONS:  Current Outpatient Medications:    Cholecalciferol (VITAMIN D3 PO), Take 4,000 Units by mouth daily., Disp: , Rfl:    etodolac (LODINE) 400 MG tablet, Take 1 tablet (400 mg total) by mouth at bedtime as needed., Disp: 30 tablet, Rfl: 5   Magnesium 500 MG CAPS, Take 250 mg by mouth daily., Disp: , Rfl:    oxybutynin (DITROPAN) 5 MG tablet, Take 1 tablet (5 mg total) by mouth 2 (two) times daily as needed for bladder spasms., Disp: 60 tablet, Rfl: 11   Potassium (POTASSIMIN PO), Take 1 tablet by mouth daily., Disp: , Rfl:    Probiotic Product (PROBIOTIC PO), Take by mouth., Disp: , Rfl:    SUMAtriptan (IMITREX) 100 MG tablet, 1 pill as needed when necessary headache. May repeat in 2 hours if necessary., Disp: 30 tablet, Rfl: 3   tizanidine (ZANAFLEX) 2 MG capsule, Take 1 capsule (2 mg total) by mouth 3 (three) times daily as needed for muscle spasms., Disp: 90 capsule, Rfl: 5  PAST MEDICAL HISTORY: Past Medical History:  Diagnosis Date   Cancer ([place])    Headache    Prostate cancer ([place])    Vision abnormalities     PAST SURGICAL HISTORY: Past Surgical History:  Procedure Laterality Date   CHOLECYSTECTOMY, LAPAROSCOPIC     PROSTATECTOMY     WISDOM TOOTH EXTRACTION      FAMILY HISTORY: History reviewed. No pertinent  family history.  SOCIAL HISTORY:  Social History   Socioeconomic History   Marital status: Married    Spouse name: Not on file   Number of children: Not on file   Years of education: Not on file   Highest education level: Not on file  Occupational History   Not on file  Tobacco Use   Smoking status: Never   Smokeless tobacco: Never  Substance and Sexual Activity   Alcohol use: No    Alcohol/week: 0.0 standard drinks   Drug use: No   Sexual activity: Not on file  Other Topics Concern   Not on file  Social History Narrative   Not on file   Social Determinants of Health   Financial Resource Strain: Not on file  Food Insecurity: Not on file  Transportation Needs: Not on file  Physical Activity: Not on file  Stress: Not  on file  Social Connections: Not on file  Intimate Partner Violence: Not on file     PHYSICAL EXAM  Vitals:   03/26/21 1555  BP: 116/72  Pulse: 79  SpO2: 98%  Weight: 165 lb 8 oz (75.1 kg)  Height: 6\' 2"  (1.88 m)    Body mass index is 21.25 kg/m.   General: The patient is well-developed and well-nourished and in no acute distress.    Musculoskeletal: The neck has a  normal range of motion.  There is mild  tenderness in the mid occiput.  No lower paraspinal tenderness   He does not have typical upper chest/upper back fibromyalgia tender point pain.  Neurologic Exam  Mental status: The patient is alert and oriented x 3 at the time of the examination. The patient has apparent normal recent and remote memory, with an apparently normal attention span and concentration ability.   Speech is normal.  Cranial nerves: Extraocular movements are full.  Facial strength and sensation was normal.  Trapezius strength is normal.. No obvious hearing deficits are noted.  Motor:  Muscle bulk is normal.   Tone is normal.  Strength is 5/5.  He does not need to use his hands to get out of the chair.  Sensory: Sensory testing is intact to touch and vibration  sensation  Coordination: Cerebellar testing shows good FTN  Gait and station: Station is normal.  Gait is normal for age.  Tandem gait is mildly wide..  Romberg is negative.  Reflexes: Deep tendon reflexes are symmetric and normal bilaterally.  .     ASSESSMENT AND PLAN  CFIDS (chronic fatigue and immune dysfunction syndrome) (HCC)  Common migraine with intractable migraine  Neck pain  Vertigo  OSA (obstructive sleep apnea)   1.   Take Mg for migraines.    Take multivitamin (if > 1000 U vit D in MVI then reduce vit D supp to 2000 from 4000 U).    2.   stay active and exercise as tolerated..   Protein shakes for weight loss anc consider dietary consult if more weight loss 3.   prn xybutynin for nocturia.   Stop if not tolerated or no improvement after a couple weeks. 4.   Tylenol or  etodolac for neck pain.   5.   He had mild OSA that was moderate in REM sleep and worse during supine sleep (spends most of night on side).  We discussed considering CPAP as he has fatigue and mils sleepiness. Return in 6 months, sooner if new or worsening neurologic symptoms.   Audianna Landgren A. Felecia Shelling, MD, PhD 8/58/8502, 7:74 PM Certified in Neurology, Clinical Neurophysiology, Sleep Medicine, Pain Medicine and Neuroimaging  [facility] [address] [place], [place] [address] [phone]

## 2021-07-07 ENCOUNTER — Encounter: Payer: Self-pay | Admitting: Neurology

## 2021-09-29 ENCOUNTER — Telehealth: Payer: Self-pay | Admitting: Neurology

## 2021-09-29 ENCOUNTER — Ambulatory Visit (INDEPENDENT_AMBULATORY_CARE_PROVIDER_SITE_OTHER): Payer: Medicare Other | Admitting: Neurology

## 2021-09-29 ENCOUNTER — Other Ambulatory Visit: Payer: Self-pay | Admitting: Neurology

## 2021-09-29 ENCOUNTER — Encounter: Payer: Self-pay | Admitting: Neurology

## 2021-09-29 VITALS — BP 126/72 | HR 73 | Ht 74.0 in | Wt 167.5 lb

## 2021-09-29 DIAGNOSIS — G4733 Obstructive sleep apnea (adult) (pediatric): Secondary | ICD-10-CM

## 2021-09-29 DIAGNOSIS — G4719 Other hypersomnia: Secondary | ICD-10-CM

## 2021-09-29 DIAGNOSIS — D8989 Other specified disorders involving the immune mechanism, not elsewhere classified: Secondary | ICD-10-CM

## 2021-09-29 DIAGNOSIS — G9332 Myalgic encephalomyelitis/chronic fatigue syndrome: Secondary | ICD-10-CM | POA: Diagnosis not present

## 2021-09-29 DIAGNOSIS — C61 Malignant neoplasm of prostate: Secondary | ICD-10-CM

## 2021-09-29 DIAGNOSIS — M542 Cervicalgia: Secondary | ICD-10-CM

## 2021-09-29 DIAGNOSIS — G43019 Migraine without aura, intractable, without status migrainosus: Secondary | ICD-10-CM | POA: Diagnosis not present

## 2021-09-29 DIAGNOSIS — R519 Headache, unspecified: Secondary | ICD-10-CM

## 2021-09-29 DIAGNOSIS — M5431 Sciatica, right side: Secondary | ICD-10-CM

## 2021-09-29 MED ORDER — SUMATRIPTAN SUCCINATE 100 MG PO TABS: ORAL_TABLET | ORAL | 3 refills | Status: DC

## 2021-09-29 MED ORDER — TOLTERODINE TARTRATE ER 4 MG PO CP24: 4.0000 mg | ORAL_CAPSULE | Freq: Every day | ORAL | 11 refills | Status: DC

## 2021-09-29 NOTE — Progress Notes (Signed)
[facility]  PATIENT: Eduardo Lin DOB: 05/10/1950  REFERRING DOCTOR OR PCP:  Dr. Beryle Lathe [facility]) SOURCE: patient and records form [facility]  _________________________________   HISTORICAL  CHIEF COMPLAINT:  Chief Complaint  Patient presents with   Follow-up    Rm 15, alone. Here for 6 month migraine f/u. Migraines are less, but does feel like a spike going into his head. Having whole body aches.    HISTORY OF PRESENT ILLNESS:  Eduardo Lin is a 71 y.o.man with persistent migraine headaches and Chronic Fatigue Syndrome.   Update  09/29/2021: Migraines have lower frequency but intensity is a little worse - often in the occiput.    He notes more fatigue and myalgias and feels more weak and unstable.   Additionally, he has lost some weight.     He feels mentally more dulled.   He writes lists/notes to not forget.    If he has any function to go to, he takes Tylenol for the pain and caffeine for the fatigue with mild benefit.   He has lost 13 pounds in 2022 but looks stable (165 pounds January and 167 today).   He feels he has continued to lose weight and states his weight has told him he is skinnier.  He sleeps poorly some nights..  He gets up once a night to use the bathroom.   Oxybutynin has helped the bladder urgency and he takes occasionally.      He has constipation, helped by Miralax  He reports neck pain and has a grinding sensation in his neck at times with movements.   He will take etodolac, Advil or Tylenol if pain acts up more.   MRI shows multilevel degenerative changes with significant foraminal narrowing to the right at C4-C5 (he does not report pain going into the right upper arm/shoulder)  Reporting much more right leg pain and now reports numbness in right leg.    He notes dizziness.    Audiogram showed symmetric HF SNHL bilaterally.   Vestibular evaluation showed no evidence of peripheral or central vestibular.    He had  tubes placed and feels dizziness improved but tinnitus was unchanged.     For the migraines, he takes Magnesium.      He started taking 500 mg po bid Mg and 2000 mcg B12.    Since starting B12, he feels less fatigued.  (B12 was normal in 11/30/17      He also takes Vit D2 4000   He continues to have more fatigue and feels it is actually worse.   PSG showed mild OSa but mostly while on back (and wife notes he spends almost the whole night on his side.)    He has OSA (mild overall, moderate during REM, worse supine (sleeps on side though).     Labs 12/26/2020 showed normal/unremarkable ESR, CRP, RA, PSA, CK, B12  MRI cervical spine 09/09/2019 showed IMPRESSION: This MRI of the cervical spine without contrast shows the following: 1.   The spinal cord appears normal. 2.   At C4-C5, there is a right disc osteophyte complex causing moderate to moderately severe right foraminal narrowing that has some potential for right C5 nerve root compression.  This looks unchanged compared to the 2018 MRI. 3.   At C5-C6, there are degenerative changes causing mild to moderate right foraminal narrowing but no nerve root compression.  MRI Brain 09/11/2019 showed:   IMPRESSION: This MRI of the brain with and without contrast shows  the following: 1.   There are some scattered T2/FLAIR hyperintense foci in the subcortical and deep white matter.  This is most consistent with chronic microvascular ischemic change and is unchanged compared to the 10/05/2017 MRI.  Foci like these could also be seen as the sequela of migraine headache, trauma or previous inflammation/infection. 2.   Minimal chronic ethmoid sinusitis.   3.   There were no acute findings and there was a normal enhancement pattern.  Polysomnography 10/17/2019 Mild overall OSA with an AHI=7.9.   OSA was more severe during REM (REM AHI = 15.5) and also more severe during supine sleep (supine AHI = 40) Relative reduced stage N3 sleep (4.5%) No PLMS  NCV/EMG  05/03/2018 This NCV/EMG study shows the following: 1.   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 2.   No evidence of superimposed motor neuron disease, neuropathy or myopathy.   REVIEW OF SYSTEMS: Constitutional: No fevers, chills, sweats, or change in appetite.   He has fatigue.   Sleeps 8 hours/night Eyes: No visual changes, double vision, eye pain Ear, nose and throat: No hearing loss, ear pain, nasal congestion, sore throat Cardiovascular: No chest pain, palpitations Respiratory:  No shortness of breath at rest or with exertion.   No wheezes. No snoring. GastrointestinaI: No nausea, vomiting, diarrhea, abdominal pain, fecal incontinence Genitourinary:  No dysuria, urinary retention or frequency.  Once nightly nocturia. Musculoskeletal:  No neck pain, back pain Integumentary: No rash, pruritus, skin lesions Neurological: as above Psychiatric: No depression at this time.  No anxiety Endocrine: No palpitations, diaphoresis, change in appetite, change in weigh or increased thirst Hematologic/Lymphatic:  No anemia, purpura, petechiae. Allergic/Immunologic: No itchy/runny eyes, nasal congestion, recent allergic reactions, rashes  ALLERGIES: Allergies  Allergen Reactions   Nickel     Other reaction(s): Other (See Comments) Other Reaction: rash on hands   Hydrocodone-Acetaminophen     Other reaction(s): Other (See Comments) Other Reaction: hallucinate    HOME MEDICATIONS:  Current Outpatient Medications:    Cholecalciferol (VITAMIN D3 PO), Take 4,000 Units by mouth daily., Disp: , Rfl:    etodolac (LODINE) 400 MG tablet, Take 1 tablet (400 mg total) by mouth at bedtime as needed., Disp: 30 tablet, Rfl: 5   Magnesium 500 MG CAPS, Take 250 mg by mouth daily., Disp: , Rfl:    tizanidine (ZANAFLEX) 2 MG capsule, Take 1 capsule (2 mg total) by mouth 3 (three) times daily as needed for muscle spasms., Disp: 90 capsule, Rfl: 5   tolterodine (DETROL LA) 4 MG 24 hr capsule, Take 1  capsule (4 mg total) by mouth daily., Disp: 30 capsule, Rfl: 11   SUMAtriptan (IMITREX) 100 MG tablet, 1 pill as needed when necessary headache. May repeat in 2 hours if necessary., Disp: 30 tablet, Rfl: 3  PAST MEDICAL HISTORY: Past Medical History:  Diagnosis Date   Cancer ([place])    Headache    Prostate cancer ([place])    Vision abnormalities     PAST SURGICAL HISTORY: Past Surgical History:  Procedure Laterality Date   CHOLECYSTECTOMY, LAPAROSCOPIC     PROSTATECTOMY     WISDOM TOOTH EXTRACTION      FAMILY HISTORY: History reviewed. No pertinent family history.  SOCIAL HISTORY:  Social History   Socioeconomic History   Marital status: Married    Spouse name: Not on file   Number of children: Not on file   Years of education: Not on file   Highest education level: Not on file  Occupational History  Not on file  Tobacco Use   Smoking status: Never   Smokeless tobacco: Never  Substance and Sexual Activity   Alcohol use: No    Alcohol/week: 0.0 standard drinks of alcohol   Drug use: No   Sexual activity: Not on file  Other Topics Concern   Not on file  Social History Narrative   Not on file   Social Determinants of Health   Financial Resource Strain: Not on file  Food Insecurity: Not on file  Transportation Needs: Not on file  Physical Activity: Not on file  Stress: Not on file  Social Connections: Not on file  Intimate Partner Violence: Not on file     PHYSICAL EXAM  Vitals:   09/29/21 0954  BP: 126/72  Pulse: 73  Weight: 167 lb 8 oz (76 kg)  Height: $Remove'6\' 2"'bdnSiuL$  (1.88 m)    Body mass index is 21.51 kg/m.   General: The patient is well-developed and well-nourished and in no acute distress.    Musculoskeletal: The neck has a mildly reduced range of motion and fairly mild  tenderness in the occiput.  No lower paraspinal tenderness   He does not have typical upper chest/upper back fibromyalgia tender point pain.  Mild tenderness over the right piriformis  muscle.  Neurologic Exam  Mental status: The patient is alert and oriented x 3 at the time of the examination. The patient has apparent normal recent and remote memory, with an apparently normal attention span and concentration ability.   Speech is normal.  Cranial nerves: Extraocular movements are full.  Facial strength and sensation was normal.  Trapezius strength is normal.. No obvious hearing deficits are noted.  Motor:  Muscle bulk is normal.   Tone is normal.  Strength is 5/5.  He does not need to use his hands to get out of the chair.  Sensory: Sensory testing is intact to touch and vibration sensation except right sole is numb to touch.    Coordination: Cerebellar testing shows good FTN  Gait and station: Station is normal.  Gait is normal for age.  Tandem gait is mildly wide..  Romberg is negative.  Reflexes: Deep tendon reflexes are symmetric and normal bilaterally.  .     ASSESSMENT AND PLAN  Common migraine with intractable migraine  CFIDS (chronic fatigue and immune dysfunction syndrome) (HCC)  Neck pain  OSA (obstructive sleep apnea)  Excessive daytime sleepiness  Sciatica, right side - Plan: MR LUMBAR SPINE WO CONTRAST  Worsening headaches - Plan: MR BRAIN WO CONTRAST  Prostate cancer ([place]) - Plan: MR LUMBAR SPINE WO CONTRAST   1.   Due to worsening headaches and more symptoms, we will check MRI of the brain .  Continue to take Mg for migraines.      2.   stay active and exercise as tolerated..   Protein shakes for weight loss anc consider dietary consult if more weight loss Weight has been stable) 3.   change oxybutynin to detrol LA for nocturia - maybe less dry mouth side effect.     4.   Tylenol or  etodolac (vs Advil) for neck pain.   5.   He had mild OSA that was moderate in REM sleep and worse during supine sleep (spends most of night on side).  We discussed considering CPAP as he has fatigue and mils sleepiness. 6.   Check MRI lumbar spine due to  worsening radicular symptoms (were mild in 2019).  I also discussed exercises for sciatica (  piriformis stretch) Return in 6 months, sooner if new or worsening neurologic symptoms.   Monta Maiorana A. Felecia Shelling, MD, PhD 06/17/5246, 18:59 AM Certified in Neurology, Clinical Neurophysiology, Sleep Medicine, Pain Medicine and Neuroimaging  [facility] [address], [place], [place] [address] [phone]

## 2021-09-29 NOTE — Telephone Encounter (Signed)
medicare/bcbs supp NPR sent to GI

## 2021-09-30 ENCOUNTER — Other Ambulatory Visit: Payer: Self-pay | Admitting: Neurology

## 2021-10-01 ENCOUNTER — Ambulatory Visit
Admission: RE | Admit: 2021-10-01 | Discharge: 2021-10-01 | Disposition: A | Discharge status: Home / Self Care | Payer: Medicare Other | Source: Ambulatory Visit | Attending: Neurology | Admitting: Neurology

## 2021-10-01 DIAGNOSIS — M5431 Sciatica, right side: Secondary | ICD-10-CM | POA: Diagnosis not present

## 2021-10-01 DIAGNOSIS — C61 Malignant neoplasm of prostate: Secondary | ICD-10-CM | POA: Diagnosis not present

## 2021-10-02 ENCOUNTER — Telehealth: Payer: Self-pay

## 2021-10-02 ENCOUNTER — Other Ambulatory Visit: Payer: Self-pay

## 2021-10-02 MED ORDER — TOLTERODINE TARTRATE ER 4 MG PO CP24: 4.0000 mg | ORAL_CAPSULE | Freq: Every day | ORAL | 11 refills | Status: DC

## 2021-10-02 NOTE — Telephone Encounter (Signed)
PA started, awaiting response

## 2021-10-02 NOTE — Telephone Encounter (Signed)
PA Approved. This drug has been approved under the Member's Medicare Part D benefit. Approved quantity: 1 capsule per 30 days. You may fill up to a 90 day supply except for those on Specialty Tier 5, which can be filled up to a 30 day supply. Please call the pharmacy to process the prescription claim.

## 2021-10-02 NOTE — Telephone Encounter (Signed)
Submitted PA on CMM.  Key: [id number]  Waiting on determination from [place] Prior Authorization Request Form [phone] N[facility]).

## 2021-10-07 ENCOUNTER — Ambulatory Visit
Admission: RE | Admit: 2021-10-07 | Discharge: 2021-10-07 | Disposition: A | Discharge status: Home / Self Care | Payer: Medicare Other | Source: Ambulatory Visit | Attending: Neurology | Admitting: Neurology

## 2021-10-07 DIAGNOSIS — R519 Headache, unspecified: Secondary | ICD-10-CM | POA: Diagnosis not present

## 2021-10-13 ENCOUNTER — Other Ambulatory Visit: Payer: Self-pay | Admitting: Neurology

## 2021-10-13 ENCOUNTER — Encounter: Payer: Self-pay | Admitting: Neurology

## 2021-10-13 MED ORDER — AZITHROMYCIN 250 MG PO TABS: ORAL_TABLET | ORAL | 0 refills | Status: AC

## 2022-01-01 DIAGNOSIS — G478 Other sleep disorders: Secondary | ICD-10-CM | POA: Insufficient documentation

## 2022-02-28 ENCOUNTER — Encounter: Payer: Self-pay | Admitting: Neurology

## 2022-03-02 ENCOUNTER — Other Ambulatory Visit: Payer: Self-pay | Admitting: Neurology

## 2022-03-02 DIAGNOSIS — R5383 Other fatigue: Secondary | ICD-10-CM

## 2022-03-02 DIAGNOSIS — G8929 Other chronic pain: Secondary | ICD-10-CM

## 2022-03-02 DIAGNOSIS — M791 Myalgia, unspecified site: Secondary | ICD-10-CM

## 2022-03-05 LAB — LEAD, BLOOD (ADULT >= 16 YRS): Lead-Whole Blood: 2.3 ug/dL (ref 0.0–3.4)

## 2022-04-01 ENCOUNTER — Ambulatory Visit (INDEPENDENT_AMBULATORY_CARE_PROVIDER_SITE_OTHER): Payer: Medicare Other | Admitting: Neurology

## 2022-04-01 ENCOUNTER — Encounter: Payer: Self-pay | Admitting: Neurology

## 2022-04-01 VITALS — BP 109/69 | HR 74 | Ht 74.0 in | Wt 166.0 lb

## 2022-04-01 DIAGNOSIS — R519 Headache, unspecified: Secondary | ICD-10-CM | POA: Diagnosis not present

## 2022-04-01 DIAGNOSIS — G8929 Other chronic pain: Secondary | ICD-10-CM

## 2022-04-01 DIAGNOSIS — M542 Cervicalgia: Secondary | ICD-10-CM

## 2022-04-01 DIAGNOSIS — R5383 Other fatigue: Secondary | ICD-10-CM

## 2022-04-01 DIAGNOSIS — G4710 Hypersomnia, unspecified: Secondary | ICD-10-CM | POA: Diagnosis not present

## 2022-04-01 DIAGNOSIS — G9332 Myalgic encephalomyelitis/chronic fatigue syndrome: Secondary | ICD-10-CM

## 2022-04-01 DIAGNOSIS — G4733 Obstructive sleep apnea (adult) (pediatric): Secondary | ICD-10-CM

## 2022-04-01 DIAGNOSIS — M791 Myalgia, unspecified site: Secondary | ICD-10-CM

## 2022-04-01 DIAGNOSIS — D8989 Other specified disorders involving the immune mechanism, not elsewhere classified: Secondary | ICD-10-CM

## 2022-04-01 NOTE — Progress Notes (Signed)
[facility]  PATIENT: Eduardo Lin DOB: [birth year]  REFERRING DOCTOR OR PCP:  Dr. Glenda Chroman [facility] Shanon Rosser) SOURCE: patient and records form [facility]  _________________________________   HISTORICAL  CHIEF COMPLAINT:  Chief Complaint  Patient presents with   Room 10    Pt is here Alone. Pt states that his fatigue has gotten worse. Pt states that he can get a full nights rest and still be exhausted.     HISTORY OF PRESENT ILLNESS:  Eduardo Lin is a 72 y.o.man with persistent migraine headaches and Chronic Fatigue Syndrome.   Update  04/01/2022 He has fatigue and sleepiness.  We had discussed CPAP in past but he was reluctant.   His PCP also encouraged him to try CPAP.   He is a mouth breather and is concerned about his ability to use CPAP (Likely would need FFM).   Due to last PSG (June 2021) being too long ago.  he needed to do a new study and HST was ordered but unable to be performed.   PSG 09/2019 showed mild OSa (AHI 7.9) but mostly while on back  and moderate during REM, and severe while supine.     Migraines are doing better.  He often wakes up with occipital pain.  He notes myalgias and feels more weak and unstable.   Additionally, he has lost some weight.   For the migraines, he takes Magnesium.      He started taking 500 mg po bid Mg and 2000 mcg B12.    Since starting B12, he feels less fatigued.  (B12 was normal in 11/30/17      He also takes Vit D2 4000   He also feels mentally dulled.   He writes lists/notes to not forget.    If he has any function to go to, he takes Tylenol for the pain and caffeine for the fatigue with mild benefit   He gets up once a night to use the bathroom.   Oxybutynin has helped the bladder urgency and he takes occasionally.      He has constipation, helped by Miralax  He reports neck pain and has a grinding sensation in his neck at times with movements.   He will take etodolac, Advil or Tylenol if pain acts up  more.   MRI shows multilevel degenerative changes with significant foraminal narrowing to the right at C4-C5 (he does not report pain going into the right upper arm/shoulder)  Labs 12/26/2020 showed normal/unremarkable ESR, CRP, RA, PSA, CK, B12  EPWORTH SLEEPINESS SCALE  On a scale of 0 - 3 what is the chance of dozing:  Sitting and Reading:   3 Watching TV:     3 Sitting inactive in a public place: 1 Passenger in car for one hour: 3 Lying down to rest in the afternoon: 3 Sitting and talking to someone: 0 Sitting quietly after lunch:  3 In a car, stopped in traffic:  0  Total (out of 24):    16/24   moderate EDS   MRI cervical spine 09/09/2019 showed IMPRESSION: This MRI of the cervical spine without contrast shows the following: 1.   The spinal cord appears normal. 2.   At C4-C5, there is a right disc osteophyte complex causing moderate to moderately severe right foraminal narrowing that has some potential for right C5 nerve root compression.  This looks unchanged compared to the 2018 MRI. 3.   At C5-C6, there are degenerative changes causing mild to moderate right foraminal  narrowing but no nerve root compression.  MRI Brain 09/11/2019 showed:   IMPRESSION: This MRI of the brain with and without contrast shows the following: 1.   There are some scattered T2/FLAIR hyperintense foci in the subcortical and deep white matter.  This is most consistent with chronic microvascular ischemic change and is unchanged compared to the 10/05/2017 MRI.  Foci like these could also be seen as the sequela of migraine headache, trauma or previous inflammation/infection. 2.   Minimal chronic ethmoid sinusitis.   3.   There were no acute findings and there was a normal enhancement pattern.  Polysomnography 10/17/2019 Mild overall OSA with an AHI=7.9.   OSA was more severe during REM (REM AHI = 15.5) and also more severe during supine sleep (supine AHI = 40) Relative reduced stage N3 sleep (4.5%) No  PLMS  NCV/EMG 05/03/2018 This NCV/EMG study shows the following: 1.   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 2.   No evidence of superimposed motor neuron disease, neuropathy or myopathy.   REVIEW OF SYSTEMS: Constitutional: No fevers, chills, sweats, or change in appetite.   He has fatigue.   Sleeps 8 hours/night Eyes: No visual changes, double vision, eye pain Ear, nose and throat: No hearing loss, ear pain, nasal congestion, sore throat Cardiovascular: No chest pain, palpitations Respiratory:  No shortness of breath at rest or with exertion.   No wheezes. No snoring. GastrointestinaI: No nausea, vomiting, diarrhea, abdominal pain, fecal incontinence Genitourinary:  No dysuria, urinary retention or frequency.  Once nightly nocturia. Musculoskeletal:  No neck pain, back pain Integumentary: No rash, pruritus, skin lesions Neurological: as above Psychiatric: No depression at this time.  No anxiety Endocrine: No palpitations, diaphoresis, change in appetite, change in weigh or increased thirst Hematologic/Lymphatic:  No anemia, purpura, petechiae. Allergic/Immunologic: No itchy/runny eyes, nasal congestion, recent allergic reactions, rashes  ALLERGIES: Allergies  Allergen Reactions   Nickel     Other reaction(s): Other (See Comments) Other Reaction: rash on hands   Hydrocodone-Acetaminophen     Other reaction(s): Other (See Comments) Other Reaction: hallucinate    HOME MEDICATIONS:  Current Outpatient Medications:    azithromycin (ZITHROMAX Z-PAK) 250 MG tablet, Take 2 po the first day, then one po qd until complete, Disp: 6 each, Rfl: 0   Cholecalciferol (VITAMIN D3 PO), Take 4,000 Units by mouth daily., Disp: , Rfl:    etodolac (LODINE) 400 MG tablet, Take 1 tablet (400 mg total) by mouth at bedtime as needed., Disp: 30 tablet, Rfl: 5   Magnesium 500 MG CAPS, Take 250 mg by mouth daily., Disp: , Rfl:    SUMAtriptan (IMITREX) 100 MG tablet, 1 pill as needed when  necessary headache. May repeat in 2 hours if necessary., Disp: 30 tablet, Rfl: 3   tizanidine (ZANAFLEX) 2 MG capsule, Take 1 capsule (2 mg total) by mouth 3 (three) times daily as needed for muscle spasms., Disp: 90 capsule, Rfl: 5   tolterodine (DETROL LA) 4 MG 24 hr capsule, Take 1 capsule (4 mg total) by mouth daily., Disp: 30 capsule, Rfl: 11  PAST MEDICAL HISTORY: Past Medical History:  Diagnosis Date   Cancer ([place])    Headache    Prostate cancer ([place])    Vision abnormalities     PAST SURGICAL HISTORY: Past Surgical History:  Procedure Laterality Date   CHOLECYSTECTOMY, LAPAROSCOPIC     PROSTATECTOMY     WISDOM TOOTH EXTRACTION      FAMILY HISTORY: History reviewed. No pertinent family history.  SOCIAL HISTORY:  Social History   Socioeconomic History   Marital status: Married    Spouse name: Not on file   Number of children: Not on file   Years of education: Not on file   Highest education level: Not on file  Occupational History   Not on file  Tobacco Use   Smoking status: Never   Smokeless tobacco: Never  Substance and Sexual Activity   Alcohol use: No    Alcohol/week: 0.0 standard drinks of alcohol   Drug use: No   Sexual activity: Not on file  Other Topics Concern   Not on file  Social History Narrative   Not on file   Social Determinants of Health   Financial Resource Strain: Not on file  Food Insecurity: Not on file  Transportation Needs: Not on file  Physical Activity: Not on file  Stress: Not on file  Social Connections: Not on file  Intimate Partner Violence: Not on file     PHYSICAL EXAM  Vitals:   04/01/22 0955  BP: 109/69  Pulse: 74  Weight: 166 lb (75.3 kg)  Height: '6\' 2"'$  (1.88 m)    Body mass index is 21.31 kg/m.   General: The patient is well-developed and well-nourished and in no acute distress.    Musculoskeletal: The neck has mildly reduced range of motion and fairly mild  tenderness in the occiput.  No lower  paraspinal tenderness   He does not have typical upper chest/upper back fibromyalgia tender point pain.    Neurologic Exam  Mental status: The patient is alert and oriented x 3 at the time of the examination. The patient has apparent normal recent and remote memory, with an apparently normal attention span and concentration ability.   Speech is normal.  Cranial nerves: Extraocular movements are full.  Facial strength and sensation was normal.  Trapezius strength is normal.. No obvious hearing deficits are noted.  Motor:  Muscle bulk is normal.   Tone is normal.  Strength is 5/5.  He does not need to use his hands to get out of the chair.  Sensory: Sensory testing is intact to touch and vibration sensation except right sole is numb to touch.    Coordination: Cerebellar testing shows good FTN  Gait and station: Station is normal.  Gait is normal for age.  His tandem gait is mildly wide.  Romberg is negative.  Reflexes: Deep tendon reflexes are symmetric and normal bilaterally.  .     ASSESSMENT AND PLAN  OSA (obstructive sleep apnea) - Plan: Split night study  Excessive sleepiness - Plan: Split night study  Chronic intractable headache, unspecified headache type  Other fatigue  Myalgia  CFIDS (chronic fatigue and immune dysfunction syndrome) (HCC)  Neck pain    1.   PSG Split night and begin CPAP if indicated 2.   stay active and exercise as tolerated..     3.   change oxybutynin to detrol LA for nocturia - maybe less dry mouth side effect.     4.   Tylenol or  etodolac (vs Advil) for neck pain.     Sumatriptan for migraine 5.   Return in 6 months, sooner if new or worsening neurologic symptoms.   Kirill Chatterjee A. Felecia Shelling, MD, PhD 1/51/7616, 07:37 AM Certified in Neurology, Clinical Neurophysiology, Sleep Medicine, Pain Medicine and Neuroimaging  [facility] [address], [place],  [address] [phone]

## 2022-04-20 ENCOUNTER — Telehealth: Payer: Self-pay | Admitting: Neurology

## 2022-04-20 NOTE — Telephone Encounter (Signed)
Split- Medicare/BCBS supp no auth req.  Patient is scheduled at [facility] for 05/12/22 at 9 pm.  Mailed packet to the patient.

## 2022-05-12 ENCOUNTER — Ambulatory Visit (INDEPENDENT_AMBULATORY_CARE_PROVIDER_SITE_OTHER): Payer: Medicare Other | Admitting: Neurology

## 2022-05-12 DIAGNOSIS — G4710 Hypersomnia, unspecified: Secondary | ICD-10-CM

## 2022-05-12 DIAGNOSIS — G4733 Obstructive sleep apnea (adult) (pediatric): Secondary | ICD-10-CM | POA: Diagnosis not present

## 2022-05-20 NOTE — Progress Notes (Signed)
General Information  Name: Eduardo Lin, Eduardo Lin BMI: 21.31 Physician: , Eliodoro Gullett  ID: [id number] Height: 74.0 in Technician: Gaylyn Cheers  Sex: Male Weight: 166.0 lb Record: [id number]  Age: 72 [1951-02-13] Date: 05/12/2022 Scorer: [place] Medication History    Eduardo Lin is a 72 y.o.man with persistent migraine headaches and Chronic Fatigue Syndrome. He has fatigue and sleepiness. We had discussed CPAP in past but he was reluctant. His PCP also encouraged him to try CPAP. He is a mouth breather and is concerned about his ability to use CPAP (Likely would need FFM). Due to last PSG (June 2021) being too long ago. he needed to do a new study and HST was ordered but unable to be performed. PSG 09/2019 showed mild OSa (AHI 7.9) but mostly while on back and moderate during REM, and severe while supine.  Vitamin D3, Lodine, Magnesium, Imitrex, Zanaflex, Detrol LA     Sleep Disorder Findings:   Sleep efficiency of 68%.   Normal sleep architecture was noted Moderate OSA with an AHI = 24.6 (worse during supine sleep with supine AHI = 55).    There were 33 apneas (27 obstructive and 4 central) and 91 hypopneas No significant PLMS with PLMSI of 2.6/hr. Recommendation: AutoPAP 5-16 cm H2O pressure with heated humidifier.  Download in 30 - 60 days Follow up with Dr. Rosana Hoes, MD, PhD Board Certified in Neurology and Sleep Medicine     Comments   Patient arrived for a diagnostic polysomnogram. Procedure explained and all questions answered. Standard paste setup without complications. Patient slept supine, right, and left. Mild snoring was heard. Respiratory events observed, primarily while supine. After two hours total sleep time, AHI = 25. No obvious cardiac arrhythmias noted. No significant PLMS observed. Patient had one restroom visit.    Lights out: 09:26:25 PM Lights on: 04:51:22 AM   Time Total Supine Side Prone Upright  Recording (TRT) 7h 25.11m2h 0.066mh  25.217m53m 0.217m 39m0.217m  81mep (TST) 5h 2.217m 0h57m.217m 4h 43m217m 0h 017m 0h 0.83m  Late28m N1 N2 N3 REM Onset Per. Slp. Eff.  Actual 0h 0.217m 0h 13.561mh 0.217m 3217m3.7m 037m5.7m 058m8.217m 678m%   Stg37mr Wake N1 N2 N3 REM  Total 143.0 29.0 194.0 0.0 79.0  Supine 96.0 17.0 7.0 0.0 0.0  Side 47.0 12.0 187.0 0.0 79.0  Prone 0.0 0.0 0.0 0.0 0.0  Upright 0.0 0.0 0.0 0.0 0.0   Stg % Wake N1 N2 N3 REM  Total 32.1 9.6 64.2 0.0 26.2  Supine 21.6 5.6 2.3 0.0 0.0  Side 10.6 4.0 61.9 0.0 26.2  Prone 0.0 0.0 0.0 0.0 0.0  Upright 0.0 0.0 0.0 0.0 0.0     Apnea Summary Sub Supine Side Prone Upright  Total 33 Total 33 21 12 0 0    REM 6 0 6 0 0    NREM '27 21 6 '$ 0 0  Obs 27 REM 5 0 5 0 0    NREM '22 17 5 '$ 0 0  Mix 2 REM 1 0 1 0 0    NREM 1 0 1 0 0  Cen 4 REM 0 0 0 0 0    NREM 4 4 0 0 0   Rera Summary Sub Supine Side Prone Upright  Total 0 Total 0 0 0 0 0    REM 0 0 0 0 0    NREM 0 0 0 0 0  Hypopnea Summary Sub Supine Side Prone Upright  Total 91 Total 91 2 89 0 0    REM 31 0 31 0 0    NREM 60 2 58 0 0   4% Hypopnea Summary Sub Supine Side Prone Upright  Total (4%) 54 Total 54 1 53 0 0    REM 22 0 22 0 0    NREM 32 1 31 0 0     AHI Total Obs Mix Cen  24.64 Apnea 6.56 5.36 0.40 0.79   Hypopnea 18.08 -- -- --  17.28 Hypopnea (4%) 10.73 -- -- --    Total Supine Side Prone Upright  Position AHI 24.64 57.50 21.80 0.00 0.00  REM AHI 28.10   NREM AHI 23.41   Position RDI 24.64 57.50 21.80 0.00 0.00  REM RDI 28.10   NREM RDI 23.41    4% Hypopnea Total Supine Side Prone Upright  Position AHI (4%) 17.28 55.00 14.03 0.00 0.00  REM AHI (4%) 21.27   NREM AHI (4%) 15.87   Position RDI (4%) 17.28 55.00 14.03 0.00 0.00  REM RDI (4%) 21.27   NREM RDI (4%) 15.87    Desaturation Information Threshold: 2% <100% <90% <80% <70% <60% <50% <40%  Supine 36.0 10.0 0.0 0.0 0.0 0.0 0.0  Side 150.0 25.0 0.0 0.0 0.0 0.0 0.0  Prone 0.0 0.0 0.0 0.0 0.0 0.0 0.0  Upright 0.0 0.0 0.0 0.0 0.0 0.0 0.0  Total  186.0 35.0 0.0 0.0 0.0 0.0 0.0  Index 27.3 5.1 0.0 0.0 0.0 0.0 0.0   Threshold: 3% <100% <90% <80% <70% <60% <50% <40%  Supine 24.0 10.0 0.0 0.0 0.0 0.0 0.0  Side 110.0 25.0 0.0 0.0 0.0 0.0 0.0  Prone 0.0 0.0 0.0 0.0 0.0 0.0 0.0  Upright 0.0 0.0 0.0 0.0 0.0 0.0 0.0  Total 134.0 35.0 0.0 0.0 0.0 0.0 0.0  Index 19.7 5.1 0.0 0.0 0.0 0.0 0.0   Threshold: 4% <100% <90% <80% <70% <60% <50% <40%  Supine 20.0 9.0 0.0 0.0 0.0 0.0 0.0  Side 66.0 22.0 0.0 0.0 0.0 0.0 0.0  Prone 0.0 0.0 0.0 0.0 0.0 0.0 0.0  Upright 0.0 0.0 0.0 0.0 0.0 0.0 0.0  Total 86.0 31.0 0.0 0.0 0.0 0.0 0.0  Index 12.6 4.5 0.0 0.0 0.0 0.0 0.0   Threshold: 4% <100% <90% <80% <70% <60% <50% <40%  Supine 20 9 0 0 0 0 0  Side 66 22 0 0 0 0 0  Prone 0 0 0 0 0 0 0  Upright 0 0 0 0 0 0 0  Total 86 31 0 0 0 0 0   Awakening/Arousal Information # of Awakenings 31  Wake after sleep onset 107.52m Wake after persistent sleep 98.536m Arousal Assoc. Arousals Index  Apneas 18 3.6  Hypopneas 17 3.4  Leg Movements 3 0.6  Snore 0 0.0  PTT Arousals 0 0.0  Spontaneous 25 5.0  Total 63 12.5  Leg Movement Information PLMS LMs Index  Total LMs during PLMS 13 2.6  LMs w/ Microarousals 1 0.2   LM LMs Index  w/ Microarousal 2 0.4  w/ Awakening 0 0.0  w/ Resp Event 1 0.2  Spontaneous 14 2.8  Total 17 3.4     Desaturation threshold setting: 4% Minimum desaturation setting: 10 seconds SaO2 nadir: 83% The longest event was a 68 sec obstructive Hypopnea with a minimum SaO2 of 84%. The lowest SaO2 was 82% associated with a 22 sec obstructive Hypopnea.  EKG Rates EKG Avg Max Min  Awake 71 96 63  Asleep 71 89 63  EKG Events: Tachycardia

## 2022-05-21 ENCOUNTER — Other Ambulatory Visit: Payer: Self-pay

## 2022-05-21 ENCOUNTER — Telehealth: Payer: Self-pay | Admitting: *Deleted

## 2022-05-21 DIAGNOSIS — G4733 Obstructive sleep apnea (adult) (pediatric): Secondary | ICD-10-CM

## 2022-05-21 NOTE — Telephone Encounter (Signed)
-----   Message from Britt Bottom, MD sent at 05/20/2022  5:56 PM EST ----- Regarding: Sleep study Please let him know that the sleep study showed moderate obstructive sleep apnea and I recommend that he begin CPAP.  Please order AutoPap 5-15 cm H2O with heated humidifier and follow-up with me in 2 to 3 months

## 2022-05-21 NOTE — Telephone Encounter (Signed)
Took call from phone staff and spoke with pt about results. Pt verbalized understanding. Aware order being sent to [facility]. They will call him in the next week. Also provided him their phone number. Advised they will have to get insurance approval first and then will schedule appt to get him set up with machine.   Relayed he need to use it at least 4 hr or more each night. Scheduled f/u 08/03/22 at 1:30pm with Dr. Felecia Shelling. Aware he needs appt 31-90 days from the date he gets CPAP machine. Advised he can call [facility] if he has any trouble with machine after being set up to help troubleshoot.   Faxed order to [facility] at [phone]. Received fax confirmation.

## 2022-08-03 ENCOUNTER — Encounter: Payer: Self-pay | Admitting: Neurology

## 2022-08-03 ENCOUNTER — Ambulatory Visit (INDEPENDENT_AMBULATORY_CARE_PROVIDER_SITE_OTHER): Payer: Medicare Other | Admitting: Neurology

## 2022-08-03 VITALS — BP 120/70 | HR 72 | Ht 74.0 in | Wt 169.5 lb

## 2022-08-03 DIAGNOSIS — G8929 Other chronic pain: Secondary | ICD-10-CM

## 2022-08-03 DIAGNOSIS — G4733 Obstructive sleep apnea (adult) (pediatric): Secondary | ICD-10-CM

## 2022-08-03 DIAGNOSIS — M542 Cervicalgia: Secondary | ICD-10-CM | POA: Diagnosis not present

## 2022-08-03 DIAGNOSIS — M791 Myalgia, unspecified site: Secondary | ICD-10-CM | POA: Diagnosis not present

## 2022-08-03 DIAGNOSIS — G43019 Migraine without aura, intractable, without status migrainosus: Secondary | ICD-10-CM | POA: Diagnosis not present

## 2022-08-03 DIAGNOSIS — R519 Headache, unspecified: Secondary | ICD-10-CM

## 2022-08-03 MED ORDER — SUMATRIPTAN SUCCINATE 100 MG PO TABS
ORAL_TABLET | ORAL | 3 refills | Status: DC
Start: 2022-08-03 — End: 2024-01-12

## 2022-08-03 MED ORDER — OXYBUTYNIN CHLORIDE 5 MG PO TABS: 5.0000 mg | ORAL_TABLET | Freq: Three times a day (TID) | ORAL | 5 refills | Status: AC

## 2022-08-03 NOTE — Progress Notes (Signed)
[facility]  PATIENT: Eduardo Lin DOB: Apr 03, 1950  REFERRING DOCTOR OR PCP:  Dr. Beryle Lathe [facility]) SOURCE: patient and records form [facility]  _________________________________   HISTORICAL  CHIEF COMPLAINT:  Chief Complaint  Patient presents with   Room 11    Pt is here Alone. Pt states that he has some dry mouth with his CPAP Machine. Pt states that he has headaches. Pt states that his Fatigue has improved with his CPAP Machine. Pt states that other than that everything else is good.     HISTORY OF PRESENT ILLNESS:  Eduardo Lin is a 72 y.o.man with persistent migraine headaches and Chronic Fatigue Syndrome.   Update  08/03/22 He is using CPAP nightly (100% compliance on download)   Efficacy is excellent with AHI=0.8 (was 24.6).    He generally sleeps 4-5 hours straight, then wakes up to urinate and has trouble falling back asleep.  He needs a FFM as breathes through the mouth nasal and chinstrap poorly tolerated.    We discussed other masks (I.e FFM Dreamwear).  He has noted a little dry mouth I advised him to turn the humidifier up  He feels better after starting CPAP with less fatigue and less weakness.Marland Kitchen  He still feels unsteady on his feet some days.  Migraines generally doing well.  However, he often wakes up with occipital pain.  He gets cramps in the gastrocnemius muscles many nights.  Myalgias are a little better since starting on CPAP.  To help with the migraines and spasms, he takes magnesium.   He also takes B12 and vitamin D.  He feels cognitively improved since starting CPAP.  He gets up twice a night to use the bathroom.   Oxybutynin has helped the bladder urgency in the past.  He takes MiraLAX for constipation.  He reports neck pain and has a grinding sensation in his neck at times with movements.   He will take etodolac, Advil or Tylenol if pain acts up more.   MRI shows multilevel degenerative changes with significant  foraminal narrowing to the right at C4-C5 (he does not report pain going into the right upper arm/shoulder)  Labs 12/26/2020 showed normal/unremarkable ESR, CRP, RA, PSA, CK, B12  EPWORTH SLEEPINESS SCALE  On a scale of 0 - 3 what is the chance of dozing:  Sitting and Reading:   3 Watching TV:     3 Sitting inactive in a public place: 1 Passenger in car for one hour: 3 Lying down to rest in the afternoon: 3 Sitting and talking to someone: 0 Sitting quietly after lunch:  3 In a car, stopped in traffic:  0  Total (out of 24):    16/24   moderate EDS   MRI cervical spine 09/09/2019 showed IMPRESSION: This MRI of the cervical spine without contrast shows the following: 1.   The spinal cord appears normal. 2.   At C4-C5, there is a right disc osteophyte complex causing moderate to moderately severe right foraminal narrowing that has some potential for right C5 nerve root compression.  This looks unchanged compared to the 2018 MRI. 3.   At C5-C6, there are degenerative changes causing mild to moderate right foraminal narrowing but no nerve root compression.  MRI Brain 09/11/2019 showed:   IMPRESSION: This MRI of the brain with and without contrast shows the following: 1.   There are some scattered T2/FLAIR hyperintense foci in the subcortical and deep white matter.  This is most consistent with chronic  microvascular ischemic change and is unchanged compared to the 10/05/2017 MRI.  Foci like these could also be seen as the sequela of migraine headache, trauma or previous inflammation/infection. 2.   Minimal chronic ethmoid sinusitis.   3.   There were no acute findings and there was a normal enhancement pattern.  Polysomnography 10/17/2019 Mild overall OSA with an AHI=7.9.   OSA was more severe during REM (REM AHI = 15.5) and also more severe during supine sleep (supine AHI = 40) Relative reduced stage N3 sleep (4.5%) No PLMS  NCV/EMG 05/03/2018 This NCV/EMG study shows the following: 1.    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 2.   No evidence of superimposed motor neuron disease, neuropathy or myopathy.   REVIEW OF SYSTEMS: Constitutional: No fevers, chills, sweats, or change in appetite.   He has fatigue.   Sleeps 8 hours/night Eyes: No visual changes, double vision, eye pain Ear, nose and throat: No hearing loss, ear pain, nasal congestion, sore throat Cardiovascular: No chest pain, palpitations Respiratory:  No shortness of breath at rest or with exertion.   No wheezes. No snoring. GastrointestinaI: No nausea, vomiting, diarrhea, abdominal pain, fecal incontinence Genitourinary:  No dysuria, urinary retention or frequency.  Once nightly nocturia. Musculoskeletal:  No neck pain, back pain Integumentary: No rash, pruritus, skin lesions Neurological: as above Psychiatric: No depression at this time.  No anxiety Endocrine: No palpitations, diaphoresis, change in appetite, change in weigh or increased thirst Hematologic/Lymphatic:  No anemia, purpura, petechiae. Allergic/Immunologic: No itchy/runny eyes, nasal congestion, recent allergic reactions, rashes  ALLERGIES: Allergies  Allergen Reactions   Nickel     Other reaction(s): Other (See Comments) Other Reaction: rash on hands   Hydrocodone-Acetaminophen     Other reaction(s): Other (See Comments) Other Reaction: hallucinate    HOME MEDICATIONS:  Current Outpatient Medications:    Cholecalciferol (VITAMIN D3 PO), Take 4,000 Units by mouth daily., Disp: , Rfl:    etodolac (LODINE) 400 MG tablet, Take 1 tablet (400 mg total) by mouth at bedtime as needed., Disp: 30 tablet, Rfl: 5   Magnesium 500 MG CAPS, Take 250 mg by mouth daily., Disp: , Rfl:    oxybutynin (DITROPAN) 5 MG tablet, Take 5 mg by mouth 3 (three) times daily., Disp: , Rfl:    SUMAtriptan (IMITREX) 100 MG tablet, 1 pill as needed when necessary headache. May repeat in 2 hours if necessary., Disp: 30 tablet, Rfl: 3   tizanidine (ZANAFLEX) 2 MG  capsule, Take 1 capsule (2 mg total) by mouth 3 (three) times daily as needed for muscle spasms., Disp: 90 capsule, Rfl: 5   azithromycin (ZITHROMAX Z-PAK) 250 MG tablet, Take 2 po the first day, then one po qd until complete (Patient not taking: Reported on 08/03/2022), Disp: 6 each, Rfl: 0  PAST MEDICAL HISTORY: Past Medical History:  Diagnosis Date   Cancer (HCC)    Headache    Prostate cancer (HCC)    Vision abnormalities     PAST SURGICAL HISTORY: Past Surgical History:  Procedure Laterality Date   CHOLECYSTECTOMY, LAPAROSCOPIC     PROSTATECTOMY     WISDOM TOOTH EXTRACTION      FAMILY HISTORY: History reviewed. No pertinent family history.  SOCIAL HISTORY:  Social History   Socioeconomic History   Marital status: Married    Spouse name: Not on file   Number of children: Not on file   Years of education: Not on file   Highest education level: Not on file  Occupational  History   Not on file  Tobacco Use   Smoking status: Never   Smokeless tobacco: Never  Substance and Sexual Activity   Alcohol use: No    Alcohol/week: 0.0 standard drinks of alcohol   Drug use: No   Sexual activity: Not on file  Other Topics Concern   Not on file  Social History Narrative   Left Handed   1 Cup of Coffee per Day   Social Determinants of Health   Financial Resource Strain: Not on file  Food Insecurity: Not on file  Transportation Needs: Not on file  Physical Activity: Not on file  Stress: Not on file  Social Connections: Not on file  Intimate Partner Violence: Not on file     PHYSICAL EXAM  Vitals:   08/03/22 1331  BP: 120/70  Pulse: 72  Weight: 169 lb 8 oz (76.9 kg)  Height: 6\' 2"  (1.88 m)    Body mass index is 21.76 kg/m.   General: The patient is well-developed and well-nourished and in no acute distress.    Musculoskeletal: The neck has mildly reduced range of motion and fairly mild  tenderness in the occiput.  No lower paraspinal tenderness   He does  not have typical upper chest/upper back fibromyalgia tender point pain.    Neurologic Exam  Mental status: The patient is alert and oriented x 3 at the time of the examination. The patient has apparent normal recent and remote memory, with an apparently normal attention span and concentration ability.   Speech is normal.  Cranial nerves: Extraocular movements are full.  Facial strength and sensation was normal.  Trapezius strength is normal.. No obvious hearing deficits are noted.  Motor:  Muscle bulk is normal.   Tone is normal.  Strength is 5/5.  He does not need to use his hands to get out of the chair.  Sensory: Sensory testing is intact to touch and vibration sensation except right sole is numb to touch.    Coordination: Cerebellar testing shows good FTN  Gait and station: Station is normal.  Gait is normal for age.  The tandem gait is mildly wide.  Romberg is normal.  Reflexes: Deep tendon reflexes are symmetric and normal bilaterally.  .     ASSESSMENT AND PLAN  OSA (obstructive sleep apnea)  Myalgia  Neck pain  Chronic intractable headache, unspecified headache type  Common migraine with intractable migraine    1.   Continue Auto-PAP.  Consider a different mask 2.   stay active and exercise as tolerated..     3.   continue oxybutynin.  If dry mouth worsens consider change to Vesicare or trospium 4.   Tylenol or  etodolac for neck pain.     Sumatriptan for migraine 5.   Return in 6 months, sooner if new or worsening neurologic symptoms.   Edithe Dobbin A. Epimenio Foot, MD, PhD 08/03/2022, 2:28 PM Certified in Neurology, Clinical Neurophysiology, Sleep Medicine, Pain Medicine and Neuroimaging  [facility] [address] [place], [place] [address] [phone]

## 2022-09-30 ENCOUNTER — Ambulatory Visit: Payer: Medicare Other | Admitting: Neurology

## 2022-11-14 ENCOUNTER — Encounter: Payer: Self-pay | Admitting: Neurology

## 2022-11-17 ENCOUNTER — Other Ambulatory Visit: Payer: Self-pay | Admitting: Neurology

## 2022-11-17 MED ORDER — BACLOFEN 10 MG PO TABS: ORAL_TABLET | ORAL | 3 refills | Status: DC

## 2023-02-16 ENCOUNTER — Other Ambulatory Visit: Payer: Self-pay | Admitting: Neurology

## 2023-02-16 NOTE — Telephone Encounter (Signed)
Last seen on 08/03/22 Follow up scheduled on 02/22/23

## 2023-02-18 ENCOUNTER — Ambulatory Visit: Payer: Medicare Other | Admitting: Neurology

## 2023-02-22 ENCOUNTER — Encounter: Payer: Self-pay | Admitting: Neurology

## 2023-02-22 ENCOUNTER — Ambulatory Visit (INDEPENDENT_AMBULATORY_CARE_PROVIDER_SITE_OTHER): Payer: Medicare Other | Admitting: Neurology

## 2023-02-22 VITALS — BP 116/70 | HR 66 | Ht 73.0 in | Wt 169.5 lb

## 2023-02-22 DIAGNOSIS — G4733 Obstructive sleep apnea (adult) (pediatric): Secondary | ICD-10-CM

## 2023-02-22 DIAGNOSIS — M791 Myalgia, unspecified site: Secondary | ICD-10-CM

## 2023-02-22 DIAGNOSIS — M542 Cervicalgia: Secondary | ICD-10-CM

## 2023-02-22 DIAGNOSIS — G4719 Other hypersomnia: Secondary | ICD-10-CM

## 2023-02-22 DIAGNOSIS — R5383 Other fatigue: Secondary | ICD-10-CM

## 2023-02-22 DIAGNOSIS — G43019 Migraine without aura, intractable, without status migrainosus: Secondary | ICD-10-CM

## 2023-02-22 NOTE — Progress Notes (Addendum)
[facility]  PATIENT: Eduardo Lin DOB: [birth year]  REFERRING DOCTOR OR PCP:  Dr. Beryle Lathe [facility] Dad) SOURCE: patient and records form [facility]  _________________________________   HISTORICAL  CHIEF COMPLAINT:  Chief Complaint  Patient presents with   Follow-up    Pt in room 10 alone.Here for cpap follow up. Patient reports doing well. Pt stopped baclofen due to dizziness.     HISTORY OF PRESENT ILLNESS:  Male Meath is a 72 y.o.man with persistent migraine headaches and Chronic Fatigue Syndrome.   Update  08/03/22 He is using CPAP most nights > 4 hours (72%) but some nights < 4 hours (wake sup to urinate)    Efficacy is ok at AHI 6.2.  We discussed different masks   He generally sleeps 4-5 hours straight, then wakes up to urinate and has trouble falling back asleep.  He needs a FFM as breathes through the mouth nasal and chinstrap poorly tolerated.    He has noted a little dry mouth I advised him to turn the humidifier up  He feels better after starting CPAP with less fatigue and less weakness.Marland Kitchen  He still feels unsteady on his feet some days.  Migraines generally doing well.  However, he often wakes up with occipital pain.  He gets cramps in the gastrocnemius muscles many nights.  Myalgias are a little better since starting on CPAP.  To help with the migraines and spasms, he takes magnesium.   He also takes B12 and vitamin D.  He feels cognitively improved since starting CPAP.  He gets up twice a night to use the bathroom.   Oxybutynin has helped the bladder urgencybut he is not using much.     He takes MiraLAX for constipation.  He reports headaches starting with neck pain   He will take etodolac, Advil or Tylenol if pain acts up more.   MRI shows multilevel degenerative changes with significant foraminal narrowing to the right at C4-C5 (he does not report pain going into the right upper arm/shoulder).  Baclofen was poorly tolerated.     Labs 12/26/2020 showed normal/unremarkable ESR, CRP, RA, PSA, CK, B12  EPWORTH SLEEPINESS SCALE  On a scale of 0 - 3 what is the chance of dozing:  Sitting and Reading:   1 Watching TV:     2 Sitting inactive in a public place: 0 Passenger in car for one hour: 1 Lying down to rest in the afternoon: 3 Sitting and talking to someone: 0 Sitting quietly after lunch:  3 In a car, stopped in traffic:  0  Total (out of 24):    10/24   moderate EDS     was 16/24 before CPAP   MRI cervical spine 09/09/2019 showed IMPRESSION: This MRI of the cervical spine without contrast shows the following: 1.   The spinal cord appears normal. 2.   At C4-C5, there is a right disc osteophyte complex causing moderate to moderately severe right foraminal narrowing that has some potential for right C5 nerve root compression.  This looks unchanged compared to the 2018 MRI. 3.   At C5-C6, there are degenerative changes causing mild to moderate right foraminal narrowing but no nerve root compression.  MRI Brain 09/11/2019 showed:   IMPRESSION: This MRI of the brain with and without contrast shows the following: 1.   There are some scattered T2/FLAIR hyperintense foci in the subcortical and deep white matter.  This is most consistent with chronic microvascular ischemic change and is unchanged compared  to the 10/05/2017 MRI.  Foci like these could also be seen as the sequela of migraine headache, trauma or previous inflammation/infection. 2.   Minimal chronic ethmoid sinusitis.   3.   There were no acute findings and there was a normal enhancement pattern.  Polysomnography 10/17/2019 Mild overall OSA with an AHI=7.9.   OSA was more severe during REM (REM AHI = 15.5) and also more severe during supine sleep (supine AHI = 40) Relative reduced stage N3 sleep (4.5%) No PLMS  NCV/EMG 05/03/2018 This NCV/EMG study shows the following: 1.   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 2.   No evidence of superimposed  motor neuron disease, neuropathy or myopathy.   REVIEW OF SYSTEMS: Constitutional: No fevers, chills, sweats, or change in appetite.   He has fatigue.   Sleeps 8 hours/night Eyes: No visual changes, double vision, eye pain Ear, nose and throat: No hearing loss, ear pain, nasal congestion, sore throat Cardiovascular: No chest pain, palpitations Respiratory:  No shortness of breath at rest or with exertion.   No wheezes. No snoring. GastrointestinaI: No nausea, vomiting, diarrhea, abdominal pain, fecal incontinence Genitourinary:  No dysuria, urinary retention or frequency.  Once nightly nocturia. Musculoskeletal:  No neck pain, back pain Integumentary: No rash, pruritus, skin lesions Neurological: as above Psychiatric: No depression at this time.  No anxiety Endocrine: No palpitations, diaphoresis, change in appetite, change in weigh or increased thirst Hematologic/Lymphatic:  No anemia, purpura, petechiae. Allergic/Immunologic: No itchy/runny eyes, nasal congestion, recent allergic reactions, rashes  ALLERGIES: Allergies  Allergen Reactions   Nickel     Other reaction(s): Other (See Comments) Other Reaction: rash on hands   Hydrocodone-Acetaminophen     Other reaction(s): Other (See Comments) Other Reaction: hallucinate    HOME MEDICATIONS:  Current Outpatient Medications:    Cholecalciferol (VITAMIN D3 PO), Take 4,000 Units by mouth daily., Disp: , Rfl:    etodolac (LODINE) 400 MG tablet, Take 1 tablet (400 mg total) by mouth at bedtime as needed., Disp: 30 tablet, Rfl: 5   Magnesium 500 MG CAPS, Take 250 mg by mouth daily., Disp: , Rfl:    oxybutynin (DITROPAN) 5 MG tablet, Take 1 tablet (5 mg total) by mouth 3 (three) times daily., Disp: 90 tablet, Rfl: 5   SUMAtriptan (IMITREX) 100 MG tablet, 1 pill as needed when necessary headache. May repeat in 2 hours if necessary., Disp: 30 tablet, Rfl: 3   azithromycin (ZITHROMAX Z-PAK) 250 MG tablet, Take 2 po the first day, then one  po qd until complete (Patient not taking: Reported on 08/03/2022), Disp: 6 each, Rfl: 0   baclofen (LIORESAL) 10 MG tablet, TAKE 1/2 TO 1 TABLET AT BEDTIME AS NEEDED (Patient not taking: Reported on 02/22/2023), Disp: 90 tablet, Rfl: 0   tizanidine (ZANAFLEX) 2 MG capsule, Take 1 capsule (2 mg total) by mouth 3 (three) times daily as needed for muscle spasms. (Patient not taking: Reported on 02/22/2023), Disp: 90 capsule, Rfl: 5  PAST MEDICAL HISTORY: Past Medical History:  Diagnosis Date   Cancer (HCC)    Headache    Prostate cancer (HCC)    Vision abnormalities     PAST SURGICAL HISTORY: Past Surgical History:  Procedure Laterality Date   CHOLECYSTECTOMY, LAPAROSCOPIC     PROSTATECTOMY     WISDOM TOOTH EXTRACTION      FAMILY HISTORY: No family history on file.  SOCIAL HISTORY:  Social History   Socioeconomic History   Marital status: Married    Spouse name:  Not on file   Number of children: Not on file   Years of education: Not on file   Highest education level: Not on file  Occupational History   Not on file  Tobacco Use   Smoking status: Never   Smokeless tobacco: Never  Substance and Sexual Activity   Alcohol use: No    Alcohol/week: 0.0 standard drinks of alcohol   Drug use: No   Sexual activity: Not on file  Other Topics Concern   Not on file  Social History Narrative   Left Handed   1 Cup of Coffee per Day   Social Determinants of Health   Financial Resource Strain: Not on file  Food Insecurity: Not on file  Transportation Needs: Not on file  Physical Activity: Not on file  Stress: Not on file  Social Connections: Not on file  Intimate Partner Violence: Low Risk  (03/16/2022)   Received from [facility] visits prior to 05/16/2022., [facility] visits prior to 05/16/2022.   Safety    How often does anyone, including family and friends, physically hurt you?: Never    How often does anyone, including family and  friends, insult or talk down to you?: Never    How often does anyone, including family and friends, threaten you with harm?: Never    How often does anyone, including family and friends, scream or curse at you?: Never     PHYSICAL EXAM  Vitals:   02/22/23 0956  BP: 116/70  Pulse: 66  Weight: 169 lb 8 oz (76.9 kg)  Height: 6\' 1"  (1.854 m)    Body mass index is 22.36 kg/m.   General: The patient is well-developed and well-nourished and in no acute distress.    Musculoskeletal: The neck has mildly reduced range of motion and mild tenderness.   No lower paraspinal tenderness     Neurologic Exam  Mental status: The patient is alert and oriented x 3 at the time of the examination. The patient has apparent normal recent and remote memory, with an apparently normal attention span and concentration ability.   Speech is normal.  Cranial nerves: Extraocular movements are full.  Facial strength and sensation was normal.  Trapezius strength is normal.. No obvious hearing deficits are noted.  Motor:  Muscle bulk is normal.   Tone is normal.  Strength is 5/5.  He does not need to use his hands to get out of the chair.  Sensory: Sensory testing is intact to touch and vibration sensation except right sole is numb to touch.    Coordination: Cerebellar testing shows good FTN  Gait and station: Station is normal.  Gait is normal for age.  The tandem gait is mildly wide.  Romberg is normal.  Reflexes: Deep tendon reflexes are symmetric and normal bilaterally.  .     ASSESSMENT AND PLAN  OSA (obstructive sleep apnea)  Myalgia  Neck pain  Common migraine with intractable migraine  Other fatigue  Excessive daytime sleepiness    1.   Continue Auto-PAP. Try to wear whole night.  He may try a different mask 2.   stay active and exercise as tolerated..     3.   Try to do bid oxybutynin.  If dry mouth worsens consider change to Vesicare or trospium 4.   Tylenol or  etodolac for neck  pain.   Sumatriptan for migraine 5.   Return in 6 months, sooner if new or worsening neurologic symptoms.  This visit is part of a comprehensive longitudinal care medical relationship regarding the patients primary diagnosis of OSA and related concerns.   Abdalrahman Clementson A. Epimenio Foot, MD, PhD 02/22/2023, 10:27 AM Certified in Neurology, Clinical Neurophysiology, Sleep Medicine, Pain Medicine and Neuroimaging  [facility] [address], [address] [place], [place] [address] [phone]

## 2023-02-22 NOTE — Addendum Note (Signed)
Addended by: Despina Arias A on: 02/22/2023 05:53 PM   Modules accepted: Level of Service

## 2023-03-09 DIAGNOSIS — R972 Elevated prostate specific antigen [PSA]: Secondary | ICD-10-CM | POA: Insufficient documentation

## 2023-09-01 NOTE — Progress Notes (Signed)
 Eduardo Lin

## 2023-09-02 ENCOUNTER — Encounter: Payer: Self-pay | Admitting: Neurology

## 2023-09-02 ENCOUNTER — Ambulatory Visit: Payer: Medicare Other | Admitting: Neurology

## 2023-09-02 VITALS — BP 113/72 | HR 84 | Ht 74.0 in | Wt 166.5 lb

## 2023-09-02 DIAGNOSIS — G4733 Obstructive sleep apnea (adult) (pediatric): Secondary | ICD-10-CM | POA: Diagnosis not present

## 2023-09-02 DIAGNOSIS — M791 Myalgia, unspecified site: Secondary | ICD-10-CM | POA: Diagnosis not present

## 2023-09-02 DIAGNOSIS — H9313 Tinnitus, bilateral: Secondary | ICD-10-CM | POA: Insufficient documentation

## 2023-09-02 DIAGNOSIS — R2689 Other abnormalities of gait and mobility: Secondary | ICD-10-CM | POA: Insufficient documentation

## 2023-09-02 DIAGNOSIS — G43019 Migraine without aura, intractable, without status migrainosus: Secondary | ICD-10-CM

## 2023-09-02 DIAGNOSIS — M542 Cervicalgia: Secondary | ICD-10-CM | POA: Diagnosis not present

## 2023-09-02 DIAGNOSIS — C61 Malignant neoplasm of prostate: Secondary | ICD-10-CM

## 2023-09-02 DIAGNOSIS — H903 Sensorineural hearing loss, bilateral: Secondary | ICD-10-CM | POA: Insufficient documentation

## 2023-09-02 DIAGNOSIS — M5430 Sciatica, unspecified side: Secondary | ICD-10-CM | POA: Insufficient documentation

## 2023-09-02 DIAGNOSIS — M5431 Sciatica, right side: Secondary | ICD-10-CM

## 2023-09-02 NOTE — Progress Notes (Signed)
 [facility]  PATIENT: Eduardo Lin DOB: 11/04/1950  REFERRING DOCTOR OR PCP:  Dr. Jen [facility]) SOURCE: patient and records form [facility]  _________________________________   HISTORICAL  CHIEF COMPLAINT:  Chief Complaint  Patient presents with   RM10/CPAP    Pt is here Alone. Pt states that he isn't comfortable with his mask. Pt states that only a full face mask he can handle. Pt states that when he gets up to go to the bathroom at night he forgets to put it back on. Pt states that he has been having sciatic nerve pain that travels down to his right leg down to his foot. ESS 13    HISTORY OF PRESENT ILLNESS:  Eduardo Lin is a 73 y.o.man with persistent migraine headaches and Chronic Fatigue Syndrome.   Update  09/02/2023 He is using CPAP most nights > 4 hours (82%) but some nights < 4 hours (wake sup to urinate)    Efficacy is ok at AHI 7.8.  We discussed different masks   He generally sleeps 4-5 hours straight, then wakes up to urinate and has trouble falling back asleep.  He needs a FFM as breathes through the mouth nasal and chinstrap poorly tolerated.    He has noted a little dry mouth I advised him to turn the humidifier up   He feels better after starting CPAP with less fatigue and less weakness.Aaron Aas  He still feels unsteady on his feet some days.   He feels cognitively improved since starting CPAP.  Migraines generally doing well.  Magnesium has helped the migraine frequency.   He sometimes wakes up woth neck pain and occipital pain.  He gets cramps in the gastrocnemius muscles many nights.  Baclofen  made him dizzy.    He also takes B12 and vitamin D .  He gets up twice a night to use the bathroom.   He has no hesitancy.   Oxybutynin  has helped the bladder urgency and occasional but he is not using much.  He does not take at night only if out of house.    He takes MiraLAX for constipation with benefit.  He has right sciatica with  tenderness over right piriformis / sciatic notch  He reports headaches starting with neck pain   He will take etodolac , Advil or Tylenol if pain acts up more.   MRI shows multilevel degenerative changes with significant foraminal narrowing to the right at C4-C5 (he does not report pain going into the right upper arm/shoulder).  Baclofen  was poorly tolerated.    Labs 12/26/2020 showed normal/unremarkable ESR, CRP, RA, PSA, CK, B12  EPWORTH SLEEPINESS SCALE  On a scale of 0 - 3 what is the chance of dozing:  Sitting and Reading:   1 Watching TV:     2 Sitting inactive in a public place: 0 Passenger in car for one hour: 1 Lying down to rest in the afternoon: 3 Sitting and talking to someone: 0 Sitting quietly after lunch:  3 In a car, stopped in traffic:  0  Total (out of 24):    10/24   moderate EDS     was 16/24 before CPAP   MRI cervical spine 09/09/2019 showed IMPRESSION: This MRI of the cervical spine without contrast shows the following: 1.   The spinal cord appears normal. 2.   At C4-C5, there is a right disc osteophyte complex causing moderate to moderately severe right foraminal narrowing that has some potential for right C5 nerve root compression.  This looks unchanged compared to the 2018 MRI. 3.   At C5-C6, there are degenerative changes causing mild to moderate right foraminal narrowing but no nerve root compression.  MRI Brain 09/11/2019 showed:   IMPRESSION: This MRI of the brain with and without contrast shows the following: 1.   There are some scattered T2/FLAIR hyperintense foci in the subcortical and deep white matter.  This is most consistent with chronic microvascular ischemic change and is unchanged compared to the 10/05/2017 MRI.  Foci like these could also be seen as the sequela of migraine headache, trauma or previous inflammation/infection. 2.   Minimal chronic ethmoid sinusitis.   3.   There were no acute findings and there was a normal enhancement  pattern.  Polysomnography 10/17/2019 Mild overall OSA with an AHI=7.9.   OSA was more severe during REM (REM AHI = 15.5) and also more severe during supine sleep (supine AHI = 40) Relative reduced stage N3 sleep (4.5%) No PLMS  NCV/EMG 05/03/2018 This NCV/EMG study shows the following: 1.   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 2.   No evidence of superimposed motor neuron disease, neuropathy or myopathy.   REVIEW OF SYSTEMS: Constitutional: No fevers, chills, sweats, or change in appetite.   He has fatigue.   Sleeps 8 hours/night Eyes: No visual changes, double vision, eye pain Ear, nose and throat: No hearing loss, ear pain, nasal congestion, sore throat Cardiovascular: No chest pain, palpitations Respiratory:  No shortness of breath at rest or with exertion.   No wheezes. No snoring. GastrointestinaI: No nausea, vomiting, diarrhea, abdominal pain, fecal incontinence Genitourinary:  No dysuria, urinary retention or frequency.  Once nightly nocturia. Musculoskeletal:  No neck pain, back pain Integumentary: No rash, pruritus, skin lesions Neurological: as above Psychiatric: No depression at this time.  No anxiety Endocrine: No palpitations, diaphoresis, change in appetite, change in weigh or increased thirst Hematologic/Lymphatic:  No anemia, purpura, petechiae. Allergic/Immunologic: No itchy/runny eyes, nasal congestion, recent allergic reactions, rashes  ALLERGIES: Allergies  Allergen Reactions   Nickel     Other reaction(s): Other (See Comments) Other Reaction: rash on hands   Hydrocodone-Acetaminophen     Other reaction(s): Other (See Comments) Other Reaction: hallucinate    HOME MEDICATIONS:  Current Outpatient Medications:    Magnesium 500 MG CAPS, Take 250 mg by mouth daily., Disp: , Rfl:    oxybutynin  (DITROPAN ) 5 MG tablet, Take 1 tablet (5 mg total) by mouth 3 (three) times daily., Disp: 90 tablet, Rfl: 5   SUMAtriptan  (IMITREX ) 100 MG tablet, 1 pill as  needed when necessary headache. May repeat in 2 hours if necessary., Disp: 30 tablet, Rfl: 3   azithromycin  (ZITHROMAX  Z-PAK) 250 MG tablet, Take 2 po the first day, then one po qd until complete (Patient not taking: Reported on 09/02/2023), Disp: 6 each, Rfl: 0   baclofen  (LIORESAL ) 10 MG tablet, TAKE 1/2 TO 1 TABLET AT BEDTIME AS NEEDED (Patient not taking: Reported on 09/02/2023), Disp: 90 tablet, Rfl: 0   Cholecalciferol (VITAMIN D3 PO), Take 4,000 Units by mouth daily. (Patient not taking: Reported on 09/02/2023), Disp: , Rfl:    etodolac  (LODINE ) 400 MG tablet, Take 1 tablet (400 mg total) by mouth at bedtime as needed. (Patient not taking: Reported on 09/02/2023), Disp: 30 tablet, Rfl: 5   tizanidine  (ZANAFLEX ) 2 MG capsule, Take 1 capsule (2 mg total) by mouth 3 (three) times daily as needed for muscle spasms. (Patient not taking: Reported on 09/02/2023), Disp: 90 capsule, Rfl:  5  PAST MEDICAL HISTORY: Past Medical History:  Diagnosis Date   Cancer (HCC)    Headache    Prostate cancer (HCC)    Vision abnormalities     PAST SURGICAL HISTORY: Past Surgical History:  Procedure Laterality Date   CHOLECYSTECTOMY, LAPAROSCOPIC     PROSTATECTOMY     WISDOM TOOTH EXTRACTION      FAMILY HISTORY: History reviewed. No pertinent family history.  SOCIAL HISTORY:  Social History   Socioeconomic History   Marital status: Married    Spouse name: Not on file   Number of children: Not on file   Years of education: Not on file   Highest education level: Not on file  Occupational History   Not on file  Tobacco Use   Smoking status: Never   Smokeless tobacco: Never  Substance and Sexual Activity   Alcohol use: No    Alcohol/week: 0.0 standard drinks of alcohol   Drug use: No   Sexual activity: Not on file  Other Topics Concern   Not on file  Social History Narrative   Left Handed   1 Cup of Coffee per Day   Social Drivers of Health   Financial Resource Strain: Not on file  Food  Insecurity: Not on file  Transportation Needs: Not on file  Physical Activity: Not on file  Stress: Not on file  Social Connections: Not on file  Intimate Partner Violence: Low Risk  (03/16/2022)   Received from [facility] visits prior to 05/16/2022.   Safety    How often does anyone, including family and friends, physically hurt you?: Never    How often does anyone, including family and friends, insult or talk down to you?: Never    How often does anyone, including family and friends, threaten you with harm?: Never    How often does anyone, including family and friends, scream or curse at you?: Never     PHYSICAL EXAM  Vitals:   09/02/23 1105  BP: 113/72  Pulse: 84  SpO2: 99%  Weight: 166 lb 8 oz (75.5 kg)  Height: 6' 2 (1.88 m)    Body mass index is 21.38 kg/m.   General: The patient is well-developed and well-nourished and in no acute distress.    Musculoskeletal: The neck has mildly reduced range of motion and mild tenderness.  Tender over right priformis muscle.    Neurologic Exam  Mental status: The patient is alert and oriented x 3 at the time of the examination. The patient has apparent normal recent and remote memory, with an apparently normal attention span and concentration ability.   Speech is normal.  Cranial nerves: Extraocular movements are full.  Facial strength and sensation was normal.  Trapezius strength is normal.. No obvious hearing deficits are noted.  Motor:  Muscle bulk is normal.   Tone is normal.  Strength is 5/5.    Sensory: Sensory testing is intact to touch and vibration sensation except right sole is numb to touch.    Coordination: Cerebellar testing shows good FTN  Gait and station: Station is normal.  Gait is normal for age.  The tandem gait is mildly wide.  Romberg is normal.  Reflexes: Deep tendon reflexes are symmetric and normal bilaterally.  .     ASSESSMENT AND PLAN  OSA (obstructive sleep  apnea)  Myalgia  Common migraine with intractable migraine  Neck pain  Sciatica, right side  Prostate cancer (HCC)    1.   Continue  Auto-PAP. E discussed trying to wear whole night.  2.   stay active and exercise as tolerated..     3.   Try to do oxybutynin  nightly due to nocturia increasing and prn for activities outside home.  If dry mouth becomes a problem, change to Vesicare  .   Consider referral to urology 4.   Tylenol or  etodolac  for neck pain.   Sumatriptan  for migraine.    Ok to take 1 scoop protein powder and continue Mg.    5.   Piriformis exercises for sciatica 6.   Could retry tizanidine  for spasms.  He will see if he has pills from 2023.    Return in 6 months, sooner if new or worsening neurologic symptoms.  This visit is part of a comprehensive longitudinal care medical relationship regarding the patients primary diagnosis of OSA and related concerns.   Dinnis Rog A. Godwin Lat, MD, PhD 09/02/2023, 1:38 PM Certified in Neurology, Clinical Neurophysiology, Sleep Medicine, Pain Medicine and Neuroimaging  [facility] [address] [place], [place] [address] [phone]

## 2024-01-11 ENCOUNTER — Other Ambulatory Visit: Payer: Self-pay | Admitting: Neurology

## 2024-01-12 NOTE — Telephone Encounter (Signed)
 Last seen on 09/02/23 Follow up scheduled on 04/03/24

## 2024-03-14 ENCOUNTER — Telehealth: Payer: Self-pay | Admitting: Neurology

## 2024-03-14 NOTE — Telephone Encounter (Signed)
 Zebedee from [facility] to  request to speak to Nurse . Zebedee stated they have been giving Pt his supple's however Pt would l need to get Rx from [facility]  to get supplies . Derald also has faxed over CMN  so Pt would not have to go through this for next year . Zebedee  stated that she will fill out CMN  form  and will send it through [employer] back  [phone] Fax [phone]

## 2024-03-29 NOTE — Telephone Encounter (Signed)
 LVM with fax number to fax form for supplies.

## 2024-03-30 ENCOUNTER — Telehealth: Payer: Self-pay | Admitting: Neurology

## 2024-03-30 NOTE — Progress Notes (Signed)
 SABRA

## 2024-03-30 NOTE — Telephone Encounter (Signed)
"  Patient called to confirm appointment  "

## 2024-04-03 ENCOUNTER — Ambulatory Visit: Admitting: Neurology

## 2024-04-03 ENCOUNTER — Encounter: Payer: Self-pay | Admitting: Neurology

## 2024-04-03 VITALS — BP 102/66 | HR 76 | Ht 74.0 in | Wt 169.0 lb

## 2024-04-03 DIAGNOSIS — M5431 Sciatica, right side: Secondary | ICD-10-CM | POA: Diagnosis not present

## 2024-04-03 DIAGNOSIS — G4733 Obstructive sleep apnea (adult) (pediatric): Secondary | ICD-10-CM | POA: Diagnosis not present

## 2024-04-03 DIAGNOSIS — M542 Cervicalgia: Secondary | ICD-10-CM

## 2024-04-03 DIAGNOSIS — M791 Myalgia, unspecified site: Secondary | ICD-10-CM

## 2024-04-03 DIAGNOSIS — R5383 Other fatigue: Secondary | ICD-10-CM

## 2024-04-03 DIAGNOSIS — G43019 Migraine without aura, intractable, without status migrainosus: Secondary | ICD-10-CM

## 2024-04-03 MED ORDER — CYCLOBENZAPRINE HCL 5 MG PO TABS: 5.0000 mg | ORAL_TABLET | Freq: Every evening | ORAL | 1 refills | Status: AC | PRN

## 2024-04-03 MED ORDER — SUMATRIPTAN SUCCINATE 100 MG PO TABS: ORAL_TABLET | ORAL | 3 refills | Status: AC

## 2024-04-03 NOTE — Progress Notes (Signed)
 "  [facility]  PATIENT: Eduardo Lin DOB: 1951/01/19  REFERRING DOCTOR OR PCP:  Dr. Lisa [facility]) SOURCE: patient and records form [facility]  _________________________________   HISTORICAL  CHIEF COMPLAINT:  Chief Complaint  Patient presents with   Follow-up    Room 10 Alone  Osa and migraine    HISTORY OF PRESENT ILLNESS:  Eduardo Lin is a 74 y.o.man with persistent migraine headaches and Chronic Fatigue Syndrome.   Update  04/03/2024: He often takes CPAP off after he urinated so is not always getting 4 hours use.   He took a break x 2 weeks and that showed on download.   On last download compliance was 33% and efficacy was AHI=4.7/h (baseline in 2021 was 7.9/h but 15.5/h in REM sleep).   He notes nocturia.  Oxybutynin  had helped but he noted dysphagia with food stuck in throat.  He still uses oxybutynin  some.     He generally sleeps 3-4 hours straight, then wakes up to urinate and has trouble falling back asleep.  He needs a FFM as breathes through the mouth nasal and chinstrap poorly tolerated.    He has noted a little dry mouth I advised him to turn the humidifier up     Initially, he felt better after starting CPAP with less fatigue and less weakness..  Mental fog was also better.   However, more recently he does not feel it makes him feel better.  He skipped two weeks and actually felt he slept better.    Migraines generally doing well.  Magnesium oxide (500 mg/day) has helped the migraine frequency.   He sometimes wakes up woth neck pain and occipital pain which may tigger a migraine when more intense.   Baclofen  made him dizzy.    He also takes B12 and vitamin D .    MRI shows multilevel degenerative changes with significant foraminal narrowing to the right at C4-C5 (he does not report pain going into the right upper arm/shoulder).   He gets up twice a night to use the bathroom.   He has no hesitancy.   Oxybutynin  has helped the  bladder urgency and occasional but he is not using much.  He does not take at night only if out of house.    He takes MiraLAX for constipation with benefit.  He has right sciatica with tenderness over right piriformis / sciatic notch  Labs 12/26/2020 showed normal/unremarkable ESR, CRP, RA, PSA, CK, B12  EPWORTH SLEEPINESS SCALE  On a scale of 0 - 3 what is the chance of dozing:  Sitting and Reading:   3 Watching TV:     3 Sitting inactive in a public place: 2 Passenger in car for one hour: 2 Lying down to rest in the afternoon: 3 Sitting and talking to someone: 0 Sitting quietly after lunch:  3 In a car, stopped in traffic:  0  Total (out of 24):    16/24   moderate EDS     was 16/24 before CPAP (initially 10/24 when he started CPAP).      MRI cervical spine 09/09/2019 showed IMPRESSION: This MRI of the cervical spine without contrast shows the following: 1.   The spinal cord appears normal. 2.   At C4-C5, there is a right disc osteophyte complex causing moderate to moderately severe right foraminal narrowing that has some potential for right C5 nerve root compression.  This looks unchanged compared to the 2018 MRI. 3.   At C5-C6, there are  degenerative changes causing mild to moderate right foraminal narrowing but no nerve root compression.  MRI Brain 09/11/2019 showed:   IMPRESSION: This MRI of the brain with and without contrast shows the following: 1.   There are some scattered T2/FLAIR hyperintense foci in the subcortical and deep white matter.  This is most consistent with chronic microvascular ischemic change and is unchanged compared to the 10/05/2017 MRI.  Foci like these could also be seen as the sequela of migraine headache, trauma or previous inflammation/infection. 2.   Minimal chronic ethmoid sinusitis.   3.   There were no acute findings and there was a normal enhancement pattern.  Polysomnography 10/17/2019 Mild overall OSA with an AHI=7.9.   OSA was more severe during  REM (REM AHI = 15.5) and also more severe during supine sleep (supine AHI = 40) Relative reduced stage N3 sleep (4.5%) No PLMS  NCV/EMG 05/03/2018 This NCV/EMG study shows the following: 1.   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 2.   No evidence of superimposed motor neuron disease, neuropathy or myopathy.   REVIEW OF SYSTEMS: Constitutional: No fevers, chills, sweats, or change in appetite.   He has fatigue.   Sleeps 8 hours/night Eyes: No visual changes, double vision, eye pain Ear, nose and throat: No hearing loss, ear pain, nasal congestion, sore throat Cardiovascular: No chest pain, palpitations Respiratory:  No shortness of breath at rest or with exertion.   No wheezes. No snoring. GastrointestinaI: No nausea, vomiting, diarrhea, abdominal pain, fecal incontinence Genitourinary:  No dysuria, urinary retention or frequency.  Once nightly nocturia. Musculoskeletal:  No neck pain, back pain Integumentary: No rash, pruritus, skin lesions Neurological: as above Psychiatric: No depression at this time.  No anxiety Endocrine: No palpitations, diaphoresis, change in appetite, change in weigh or increased thirst Hematologic/Lymphatic:  No anemia, purpura, petechiae. Allergic/Immunologic: No itchy/runny eyes, nasal congestion, recent allergic reactions, rashes  ALLERGIES: Allergies  Allergen Reactions   Nickel     Other reaction(s): Other (See Comments) Other Reaction: rash on hands   Hydrocodone-Acetaminophen     Other reaction(s): Other (See Comments) Other Reaction: hallucinate    HOME MEDICATIONS:  Current Outpatient Medications:    azithromycin  (ZITHROMAX  Z-PAK) 250 MG tablet, Take 2 po the first day, then one po qd until complete, Disp: 6 each, Rfl: 0   Cholecalciferol (VITAMIN D3 PO), Take 4,000 Units by mouth daily., Disp: , Rfl:    cyclobenzaprine  (FLEXERIL ) 5 MG tablet, Take 1 tablet (5 mg total) by mouth at bedtime as needed for muscle spasms., Disp: 90  tablet, Rfl: 1   etodolac  (LODINE ) 400 MG tablet, Take 1 tablet (400 mg total) by mouth at bedtime as needed., Disp: 30 tablet, Rfl: 5   Magnesium 500 MG CAPS, Take 250 mg by mouth daily., Disp: , Rfl:    oxybutynin  (DITROPAN ) 5 MG tablet, Take 1 tablet (5 mg total) by mouth 3 (three) times daily., Disp: 90 tablet, Rfl: 5   SUMAtriptan  (IMITREX ) 100 MG tablet, TAKE 1 PILL AS NEEDED WHEN NECESSARY HEADACHE. MAY REPEAT IN 2 HOURS IF NECESSARY., Disp: 10 tablet, Rfl: 3  PAST MEDICAL HISTORY: Past Medical History:  Diagnosis Date   Cancer (HCC)    Headache    Prostate cancer (HCC)    Vision abnormalities     PAST SURGICAL HISTORY: Past Surgical History:  Procedure Laterality Date   CHOLECYSTECTOMY, LAPAROSCOPIC     PROSTATECTOMY     WISDOM TOOTH EXTRACTION      FAMILY HISTORY:  History reviewed. No pertinent family history.  SOCIAL HISTORY:  Social History   Socioeconomic History   Marital status: Married    Spouse name: Not on file   Number of children: Not on file   Years of education: Not on file   Highest education level: Not on file  Occupational History   Not on file  Tobacco Use   Smoking status: Never   Smokeless tobacco: Never  Substance and Sexual Activity   Alcohol use: No    Alcohol/week: 0.0 standard drinks of alcohol   Drug use: No   Sexual activity: Not on file  Other Topics Concern   Not on file  Social History Narrative   Left Handed   1 Cup of Coffee per Day   Social Drivers of Health   Tobacco Use: Low Risk (04/03/2024)   Patient History    Smoking Tobacco Use: Never    Smokeless Tobacco Use: Never    Passive Exposure: Not on file  Financial Resource Strain: Not on file  Food Insecurity: Not on file  Transportation Needs: Not on file  Physical Activity: Not on file  Stress: Not on file  Social Connections: Not on file  Intimate Partner Violence: Low Risk  (03/16/2022)   Received from [facility] visits prior to  05/16/2022.   Safety    How often does anyone, including family and friends, physically hurt you?: Never    How often does anyone, including family and friends, insult or talk down to you?: Never    How often does anyone, including family and friends, threaten you with harm?: Never    How often does anyone, including family and friends, scream or curse at you?: Never  Depression (PHQ2-9): Not on file  Alcohol Screen: Not on file  Housing: Not on file  Utilities: Not on file  Health Literacy: Not on file     PHYSICAL EXAM  Vitals:   04/03/24 0837  BP: 102/66  Pulse: 76  SpO2: 97%  Weight: 169 lb (76.7 kg)  Height: 6' 2 (1.88 m)    Body mass index is 21.7 kg/m.   General: The patient is well-developed and well-nourished and in no acute distress.    Musculoskeletal: The neck has mildly reduced range of motion and mild tenderness.  He has some tenderness over the right piriformis muscle.  Neurologic Exam  Mental status: The patient is alert and oriented x 3 at the time of the examination. The patient has apparent normal recent and remote memory, with an apparently normal attention span and concentration ability.   Speech is normal.  Cranial nerves: Extraocular movements are full.  Facial strength and sensation was normal.  Trapezius strength is normal.. No obvious hearing deficits are noted.  Motor:  Muscle bulk is normal.   Tone is normal.  Strength is 5/5.    Sensory: Sensory testing is intact to touch and vibration sensation except right sole is numb to touch.    Coordination: Cerebellar testing shows good FTN  Gait and station: Station is normal.  Gait is normal for age.  Tandem gait is mildly wide.   Romberg is normal.  Reflexes: Deep tendon reflexes are symmetric and normal bilaterally.  .     ASSESSMENT AND PLAN  OSA (obstructive sleep apnea)  Myalgia  Common migraine with intractable migraine  Sciatica, right side  Other fatigue  Neck pain   1.    Continue Auto-PAP.  We discussed trying to wear whole night  placing back on after he uses the bathroom 2.   stay active and exercise as tolerated..     3.   Oxybutynin  prn due to nocturia and prn for activities outside home. Consider change to Vesicare  if dysphagia persists - may have been due to dry mouth.   He reports that he has a referral to urology  4.   For migraine, continue Mg.  Sumatriptan  for migraine.      5.   Piriformis exercises for sciatica.  Consider PT  6.   Trial of Flexeril  for spasms.  May also help sleep maintenance.    Return in 6 months, sooner if new or worsening neurologic symptoms.  This visit is part of a comprehensive longitudinal care medical relationship regarding the patients primary diagnosis of OSA and related concerns.   Captola Teschner A. Vear, MD, PhD 04/03/2024, 9:01 AM Certified in Neurology, Clinical Neurophysiology, Sleep Medicine, Pain Medicine and Neuroimaging  [facility] [address] [place], [place] [address] [phone] "

## 2024-12-05 ENCOUNTER — Ambulatory Visit: Admitting: Neurology
